# Patient Record
Sex: Female | Born: 1985 | Hispanic: Yes | Marital: Married | Smoking: Never smoker
Health system: Southern US, Community
[De-identification: ages and names within clinical notes are randomized; demographics above are authoritative.]

## PROBLEM LIST (undated history)

## (undated) ENCOUNTER — Inpatient Hospital Stay (HOSPITAL_COMMUNITY): Payer: Self-pay

## (undated) DIAGNOSIS — Z789 Other specified health status: Secondary | ICD-10-CM

## (undated) HISTORY — PX: NO PAST SURGERIES: SHX2092

---

## 2013-08-01 ENCOUNTER — Ambulatory Visit: Payer: No Typology Code available for payment source | Attending: Internal Medicine | Admitting: Internal Medicine

## 2013-08-01 ENCOUNTER — Other Ambulatory Visit (HOSPITAL_COMMUNITY)
Admission: RE | Admit: 2013-08-01 | Discharge: 2013-08-01 | Disposition: A | Discharge status: Home / Self Care | Payer: No Typology Code available for payment source | Source: Ambulatory Visit | Attending: Internal Medicine | Admitting: Internal Medicine

## 2013-08-01 ENCOUNTER — Encounter: Payer: Self-pay | Admitting: Internal Medicine

## 2013-08-01 VITALS — BP 103/70 | HR 66 | Temp 99.5°F | Resp 16 | Wt 192.0 lb

## 2013-08-01 DIAGNOSIS — Z0000 Encounter for general adult medical examination without abnormal findings: Secondary | ICD-10-CM

## 2013-08-01 DIAGNOSIS — Z3202 Encounter for pregnancy test, result negative: Secondary | ICD-10-CM | POA: Insufficient documentation

## 2013-08-01 DIAGNOSIS — N760 Acute vaginitis: Secondary | ICD-10-CM | POA: Insufficient documentation

## 2013-08-01 DIAGNOSIS — R350 Frequency of micturition: Secondary | ICD-10-CM | POA: Insufficient documentation

## 2013-08-01 DIAGNOSIS — IMO0002 Reserved for concepts with insufficient information to code with codable children: Secondary | ICD-10-CM

## 2013-08-01 DIAGNOSIS — Z113 Encounter for screening for infections with a predominantly sexual mode of transmission: Secondary | ICD-10-CM | POA: Insufficient documentation

## 2013-08-01 DIAGNOSIS — Z1213 Encounter for screening for malignant neoplasm of small intestine: Secondary | ICD-10-CM | POA: Insufficient documentation

## 2013-08-01 DIAGNOSIS — Z124 Encounter for screening for malignant neoplasm of cervix: Secondary | ICD-10-CM

## 2013-08-01 DIAGNOSIS — N898 Other specified noninflammatory disorders of vagina: Secondary | ICD-10-CM | POA: Insufficient documentation

## 2013-08-01 DIAGNOSIS — Z01419 Encounter for gynecological examination (general) (routine) without abnormal findings: Secondary | ICD-10-CM | POA: Insufficient documentation

## 2013-08-01 LAB — POCT URINALYSIS DIPSTICK
Bilirubin, UA: NEGATIVE
Blood, UA: NEGATIVE
Glucose, UA: NEGATIVE
KETONES UA: NEGATIVE
Leukocytes, UA: NEGATIVE
Nitrite, UA: NEGATIVE
PROTEIN UA: NEGATIVE
SPEC GRAV UA: 1.02
Urobilinogen, UA: 0.2
pH, UA: 7

## 2013-08-01 LAB — POCT URINE PREGNANCY: Preg Test, Ur: NEGATIVE

## 2013-08-01 NOTE — Progress Notes (Signed)
Pt is here to establish care. Pt is requesting a pap smear but she states that she is pregnant.

## 2013-08-01 NOTE — Patient Instructions (Signed)
Prueba de Papanicolaou (Papanicolaou Test) Es una prueba que se [place] para la deteccin del cncer de cuello de tero. Para realizarla, se extraen clulas del cuello del tero que un patlogo (un especialista en el estudio de los tejidos del organismo) examina bajo un microscopio. Las clulas se extraen del cuello del tero mediante el raspado o el cepillado del cuello del tero y sus paredes internas. Es Neomia Dearuna prueba muy precisa para la deteccin del cncer de cuello de tero y ha sido muy til para reducir las muertes a causa de esta enfermedad. Las pruebas de Papanicolaou se informan en trminos de neoplasia cervical intraepitelial (NIC). El trmino neoplasia significa crecimiento nuevo, intraepitelial significa cambios a nivel celular y cervicouterina significa en el cuello del tero. Los subtipos de NIC son los siguientes:   NIC1: displasia (tejido anormal) leve y leve a moderado  NIC2: displasia moderada y leve a grave  NIC3: displasia grave y carcinoma (tumor maligno) en proceso La prueba de Papanicolaou, cuando se la realiza peridicamente, ha resultado de gran ayuda para la deteccin temprana del cncer de cuello de tero, que es tratable si se lo descubre en su fase inicial. Adems, se la [place] para detectar anomalas o hallazgos atpicos. En muchos casos, estos hallazgos son parte del proceso de reparacin del organismo y a menudo se resuelven sin necesidad de Conservation officer, historic buildingsotro tratamiento. Si se hace un lavado vaginal, se da un bao de inmersin o se aplica cremas vaginales 48 a 72horas antes del examen, los resultados de la prueba podran ser "insatisfactorios". PREPARACIN PARA EL ESTUDIO La prueba debe realizarse cuando no tiene el perodo menstrual ni sangrado vaginal. HALLAZGOS NORMALES No se observan clulas anormales o atpicas. Los rangos para los resultados normales pueden variar entre diferentes laboratorios y hospitales. Consulte siempre con su mdico despus de Production assistant, radiohacer el estudio para Artistconocer el  significado de los [place]resultados y si los valores se consideran "dentro de los lmites normales". SIGNIFICADO DEL ESTUDIO  El mdico leer los resultados y Heritage managerhablar con usted sobre la importancia y el significado de los [place] Placeresultados, as como las opciones de tratamiento y la necesidad de Education officer, environmentalrealizar pruebas adicionales, si fuera necesario. OBTENCIN DE LOS RESULTADOS DE LAS PRUEBAS  Es su responsabilidad retirar el resultado del [place]estudio. Consulte en el laboratorio cundo y cmo podr Starbucks Corporationobtener los resultados. Document Released: 01/01/2013 [facility] LLCExitCare Patient Information 2014 [place]iaExitCare, MarylandLLC. Ejercicios para perder peso (Exercise to Lose Weight) La actividad fsica y Neomia Dearuna dieta saludable ayudan a perder peso. El mdico podr sugerirle ejercicios especficos. IDEAS Y CONSEJOS PARA HACER EJERCICIOS  Elija opciones econmicas que disfrute hacer , como caminar, andar en bicicleta o los vdeos para ejercitarse.  Utilice las Microbiologistescaleras en lugar del ascensor.  Camine durante la hora del almuerzo.  Estacione el auto lejos del lugar de Campustrabajo o Redfieldestudio.  Concurra a un gimnasio o tome clases de gimnasia.  Comience con 5  10 minutos de actividad fsica por da. Ejercite hasta 30 minutos, 4 a 6 das por [address] Church [address]semana.  Utilice zapatos que tengan un buen soporte y ropas cmodas.  Elongue antes y despus de Company secretaryejercitar.  Ejercite hasta que aumente la respiracin y el corazn palpite rpido.  Beba agua extra cuando ejercite.  No haga ejercicio Firefighterhasta lastimarse, sentirse mareado o que le falte mucho el aire. La actividad fsica puede quemar alrededor de 150 caloras.  Correr 20 cuadras en 15 minutos.  Jugar vley durante 45 a 60 minutos.  Limpiar y encerar el auto durante 45 a 60 minutos.  Jugar ftbol americano de toque.  Caminar 25 cuadras en 35 minutos.  Empujar un cochecito 20 cuadras en 30 minutos.  Jugar baloncesto durante 30 minutos.  Rastrillar hojas secas durante 30 minutos.  Andar en bicicleta  80 cuadras en 30 minutos.  Caminar 30 cuadras en 30 minutos.  Bailar durante 30 minutos.  Quitar la nieve con una pala durante 15 minutos.  Nadar vigorosamente durante 20 minutos.  Subir escaleras durante 15 minutos.  Andar en bicicleta 60 cuadras durante 15 minutos.  Arreglar el jardn entre 30 y 45 minutos.  Saltar a la soga durante 15 minutos.  Limpiar vidrios o pisos durante 45 a 60 minutos. Document Released: 06/17/2010 Document Revised: 06/05/2011 [facility]ExitCare Patient Information 2014 [place]uelExitCare, MarylandLLC.

## 2013-08-04 NOTE — Progress Notes (Signed)
[name], female   DOB: Feb 23, 1986, 28 y.o.   MRN: [id number]  [id number]  [id number]  DOB - Feb 23, 1986  CC:  Chief Complaint  Patient presents with  . Establish Care       HPI: Angela Rangel is a 28 y.o. female here today to establish medical care. Patient reports no past medical history. Patient reports a one-month history of painful intercourse. She states it feels like she has to urinate during intercourse. After intercourse she feels that it is difficult to walk due to the abdominal pain.  Patient reports increase in urinary frequency without dysuria and hematuria.  Patient reports a yellow vaginal discharge without odor. Patient requests a urine pregnancy test, last menstrual period occurred in the middle of April.  Patient has No headache, No chest pain,  No Nausea, No new weakness tingling or numbness, No Cough - SOB.  No Known Allergies History reviewed. No pertinent past medical history. No current outpatient prescriptions on file prior to visit.   No current facility-administered medications on file prior to visit.   History reviewed. No pertinent family history. History   Social History  . Marital Status: Married    Spouse Name: N/A    Number of Children: N/A  . Years of Education: N/A   Occupational History  . Not on file.   Social History Main Topics  . Smoking status: Never Smoker   . Smokeless tobacco: Not on file  . Alcohol Use: No  . Drug Use: No  . Sexual Activity: Yes   Other Topics Concern  . Not on file   Social History Narrative  . No narrative on file    Review of Systems: Constitutional: Negative for fever, chills, diaphoresis, activity change, appetite change and fatigue. HENT: Negative for ear pain, nosebleeds, congestion, facial swelling, rhinorrhea, neck pain, neck stiffness and ear discharge.  Eyes: Negative for pain, discharge, redness, itching and visual disturbance. Respiratory: Negative for cough,  choking, chest tightness, shortness of breath, wheezing and stridor.  Cardiovascular: Negative for chest pain, palpitations and leg swelling. Gastrointestinal: Negative for abdominal distention. Positive for abdominal pain after intercourse Genitourinary: Negative for dysuria, urgency, hematuria, flank pain, decreased urine volume, difficulty urinating. Positive for dyspareunia and urinary frequency. Musculoskeletal: Negative for back pain, joint swelling, arthralgia and gait problem. Neurological: Negative for dizziness, tremors, seizures, syncope, facial asymmetry, speech difficulty, weakness, light-headedness, numbness and headaches.  Hematological: Negative for adenopathy. Does not bruise/bleed easily. Psychiatric/Behavioral: Negative for hallucinations, behavioral problems, confusion, dysphoric mood, decreased concentration and agitation.    Objective:   Filed Vitals:   08/01/13 1619  BP: 103/70  Pulse: 66  Temp: 99.5 F (37.5 C)  Resp: 16    Physical Exam: Constitutional: Patient appears well-developed and well-nourished. No distress. HENT: Normocephalic, atraumatic, External right and left ear normal. Oropharynx is clear and moist.  Eyes: Conjunctivae and EOM are normal. PERRLA, no scleral icterus. Neck: Normal ROM. Neck supple. No JVD. No tracheal deviation. No thyromegaly. CVS: RRR, S1/S2 +, no murmurs, no gallops, no carotid bruit.  Pulmonary: Effort and breath sounds normal, no stridor, rhonchi, wheezes, rales.  Abdominal: Soft. BS +, no distension, rebound or guarding. Pelvic tenderness  Musculoskeletal: Normal range of motion. No edema and no tenderness. Negative for CVA tenderness. Lymphadenopathy: No lymphadenopathy noted, cervical, inguinal or axillary Neuro: Alert. Normal reflexes, muscle tone coordination. No cranial nerve deficit. Skin: Skin is warm and dry. No rash noted. Not diaphoretic. No erythema. No pallor. Psychiatric: Normal mood and affect.  Behavior,  judgment, thought content normal. Physical Exam  Genitourinary: Rectum normal and uterus normal. Cervix exhibits friability. Cervix exhibits no motion tenderness and no discharge. Right adnexum displays tenderness. Right adnexum displays no mass. Left adnexum displays tenderness. Left adnexum displays no mass. There is tenderness around the vagina.  Lymphadenopathy:       Right: No inguinal adenopathy present.       Left: No inguinal adenopathy present.     No results found for this basename: WBC, HGB, HCT, MCV, PLT   No results found for this basename: CREATININE, BUN, NA, K, CL, CO2    No results found for this basename: HGBA1C   Lipid Panel  No results found for this basename: chol, trig, hdl, cholhdl, vldl, ldlcalc       Assessment and plan:   [name] was seen today for establish care.  Diagnoses and associated orders for this visit:  Preventative health care - POCT urine pregnancy - POCT urinalysis dipstick - TSH; Future - CBC; Future - COMPLETE METABOLIC PANEL WITH GFR; Future - Lipid panel; Future - Hemoglobin A1C; Future  Papanicolaou smear - Cytology - PAP [place] - Cervicovaginal ancillary only  Dyspareunia Will wait on results of STI screening, if negative will order transvaginal ultrasound.   Return in about 1 week (around 08/08/2013), or if symptoms worsen or fail to improve, for Lab Visit. Will call patient with results of Pap.   Due to language barrier, an interpreter was present during the history-taking and subsequent discussion (and for part of the physical exam) with this patient.     Holland CommonsValerie Keck, NP-C [facility] [phone] 08/04/2013, 9:35 PM

## 2013-08-05 LAB — CERVICOVAGINAL ANCILLARY ONLY
Chlamydia: NEGATIVE
NEISSERIA GONORRHEA: NEGATIVE
WET PREP (BD AFFIRM): NEGATIVE
WET PREP (BD AFFIRM): POSITIVE — AB
Wet Prep (BD Affirm): NEGATIVE

## 2013-08-07 ENCOUNTER — Telehealth: Payer: Self-pay

## 2013-08-07 MED ORDER — METRONIDAZOLE 500 MG PO TABS: 500.0000 mg | ORAL_TABLET | Freq: Two times a day (BID) | ORAL | Status: DC

## 2013-08-07 NOTE — Telephone Encounter (Signed)
Message copied by Lestine MountJUAREZ, Wilberta Dorvil L on Thu Aug 07, 2013  5:04 PM ------      Message from: Holland CommonsKECK, VALERIE A      Created: Tue Aug 05, 2013  9:58 PM       Let patient know her STD test came back negative and her pap is normal, she will not need another pap for 3 years.  Let her know she did come back positive for Bacterial Vaginosis.  She will need metronidazole 500 mg BID for 7 days. Let her know she cannot drink alcohol while taking this medication. Let her know if she is still having painful intercourse after treatment then she needs to return to the clinic for further evaluation. Thanks ------

## 2013-08-07 NOTE — Telephone Encounter (Signed)
Interpreter line used Patient is aware of her results [facility]

## 2013-08-08 ENCOUNTER — Ambulatory Visit: Payer: No Typology Code available for payment source | Attending: Internal Medicine

## 2013-08-08 ENCOUNTER — Other Ambulatory Visit: Payer: Self-pay

## 2013-08-08 DIAGNOSIS — N760 Acute vaginitis: Principal | ICD-10-CM

## 2013-08-08 DIAGNOSIS — Z0000 Encounter for general adult medical examination without abnormal findings: Secondary | ICD-10-CM

## 2013-08-08 DIAGNOSIS — B9689 Other specified bacterial agents as the cause of diseases classified elsewhere: Secondary | ICD-10-CM

## 2013-08-08 LAB — CBC
HEMATOCRIT: 39.2 % (ref 36.0–46.0)
HEMOGLOBIN: 13.3 g/dL (ref 12.0–15.0)
MCH: 28.8 pg (ref 26.0–34.0)
MCHC: 33.9 g/dL (ref 30.0–36.0)
MCV: 84.8 fL (ref 78.0–100.0)
Platelets: 255 10*3/uL (ref 150–400)
RBC: 4.62 MIL/uL (ref 3.87–5.11)
RDW: 13.6 % (ref 11.5–15.5)
WBC: 6.6 10*3/uL (ref 4.0–10.5)

## 2013-08-08 MED ORDER — FLUCONAZOLE 150 MG PO TABS: 150.0000 mg | ORAL_TABLET | Freq: Once | ORAL | Status: DC

## 2013-08-08 MED ORDER — METRONIDAZOLE 500 MG PO TABS: 500.0000 mg | ORAL_TABLET | Freq: Two times a day (BID) | ORAL | Status: DC

## 2013-08-08 NOTE — Progress Notes (Signed)
Patient had labwork today and indicated she tried to pick-up her medications; however, pharmacy had not been sent script for Metronidazole 500 mg two times daily. Medication reordered. Discussed with Holland CommonsValerie Keck, NP who also indicated Diflucan 150 mg once should be ordered.  Order sent to pharmacy.  Patient updated.  Spanish interpretor present.

## 2013-08-09 LAB — COMPLETE METABOLIC PANEL WITH GFR
ALT: 25 U/L (ref 0–35)
AST: 21 U/L (ref 0–37)
Albumin: 4.2 g/dL (ref 3.5–5.2)
Alkaline Phosphatase: 56 U/L (ref 39–117)
BUN: 12 mg/dL (ref 6–23)
CALCIUM: 8.9 mg/dL (ref 8.4–10.5)
CHLORIDE: 105 meq/L (ref 96–112)
CO2: 25 meq/L (ref 19–32)
Creat: 0.58 mg/dL (ref 0.50–1.10)
Glucose, Bld: 82 mg/dL (ref 70–99)
Potassium: 4.4 mEq/L (ref 3.5–5.3)
SODIUM: 139 meq/L (ref 135–145)
Total Bilirubin: 0.5 mg/dL (ref 0.2–1.2)
Total Protein: 6.9 g/dL (ref 6.0–8.3)

## 2013-08-09 LAB — TSH: TSH: 2.607 u[IU]/mL (ref 0.350–4.500)

## 2013-08-09 LAB — LIPID PANEL
Cholesterol: 170 mg/dL (ref 0–200)
HDL: 36 mg/dL — ABNORMAL LOW (ref >39)
LDL CALC: 116 mg/dL — AB (ref 0–99)
Total CHOL/HDL Ratio: 4.7 Ratio
Triglycerides: 90 mg/dL (ref <150)
VLDL: 18 mg/dL (ref 0–40)

## 2013-08-09 LAB — HEMOGLOBIN A1C
HEMOGLOBIN A1C: 5.7 % — AB (ref <5.7)
Mean Plasma Glucose: 117 mg/dL — ABNORMAL HIGH (ref <117)

## 2013-12-16 ENCOUNTER — Ambulatory Visit: Payer: No Typology Code available for payment source | Admitting: Internal Medicine

## 2014-01-23 ENCOUNTER — Ambulatory Visit: Payer: Self-pay

## 2014-02-06 ENCOUNTER — Ambulatory Visit: Payer: Self-pay

## 2014-03-02 ENCOUNTER — Ambulatory Visit: Payer: Self-pay | Attending: Internal Medicine

## 2014-06-22 ENCOUNTER — Emergency Department (HOSPITAL_COMMUNITY): Payer: No Typology Code available for payment source

## 2014-06-22 ENCOUNTER — Emergency Department (HOSPITAL_COMMUNITY)
Admission: EM | Admit: 2014-06-22 | Discharge: 2014-06-22 | Disposition: A | Discharge status: Home / Self Care | Payer: No Typology Code available for payment source | Attending: Emergency Medicine | Admitting: Emergency Medicine

## 2014-06-22 ENCOUNTER — Encounter (HOSPITAL_COMMUNITY): Payer: Self-pay | Admitting: Radiology

## 2014-06-22 DIAGNOSIS — S299XXA Unspecified injury of thorax, initial encounter: Secondary | ICD-10-CM | POA: Insufficient documentation

## 2014-06-22 DIAGNOSIS — S3991XA Unspecified injury of abdomen, initial encounter: Secondary | ICD-10-CM | POA: Insufficient documentation

## 2014-06-22 DIAGNOSIS — S8001XA Contusion of right knee, initial encounter: Secondary | ICD-10-CM | POA: Insufficient documentation

## 2014-06-22 DIAGNOSIS — Y92410 Unspecified street and highway as the place of occurrence of the external cause: Secondary | ICD-10-CM | POA: Insufficient documentation

## 2014-06-22 DIAGNOSIS — S3992XA Unspecified injury of lower back, initial encounter: Secondary | ICD-10-CM | POA: Insufficient documentation

## 2014-06-22 DIAGNOSIS — Z79899 Other long term (current) drug therapy: Secondary | ICD-10-CM | POA: Diagnosis not present

## 2014-06-22 DIAGNOSIS — R202 Paresthesia of skin: Secondary | ICD-10-CM | POA: Insufficient documentation

## 2014-06-22 DIAGNOSIS — T148XXA Other injury of unspecified body region, initial encounter: Secondary | ICD-10-CM

## 2014-06-22 DIAGNOSIS — S79911A Unspecified injury of right hip, initial encounter: Secondary | ICD-10-CM | POA: Diagnosis not present

## 2014-06-22 DIAGNOSIS — S60222A Contusion of left hand, initial encounter: Secondary | ICD-10-CM | POA: Diagnosis not present

## 2014-06-22 DIAGNOSIS — S060X0A Concussion without loss of consciousness, initial encounter: Secondary | ICD-10-CM | POA: Diagnosis not present

## 2014-06-22 DIAGNOSIS — M25561 Pain in right knee: Secondary | ICD-10-CM

## 2014-06-22 DIAGNOSIS — Y9389 Activity, other specified: Secondary | ICD-10-CM | POA: Diagnosis not present

## 2014-06-22 DIAGNOSIS — Z3202 Encounter for pregnancy test, result negative: Secondary | ICD-10-CM | POA: Insufficient documentation

## 2014-06-22 DIAGNOSIS — Y998 Other external cause status: Secondary | ICD-10-CM | POA: Insufficient documentation

## 2014-06-22 DIAGNOSIS — S8991XA Unspecified injury of right lower leg, initial encounter: Secondary | ICD-10-CM | POA: Diagnosis present

## 2014-06-22 DIAGNOSIS — Z792 Long term (current) use of antibiotics: Secondary | ICD-10-CM | POA: Diagnosis not present

## 2014-06-22 LAB — CBC WITH DIFFERENTIAL/PLATELET
BASOS PCT: 0 % (ref 0–1)
Basophils Absolute: 0 10*3/uL (ref 0.0–0.1)
Eosinophils Absolute: 0.5 10*3/uL (ref 0.0–0.7)
Eosinophils Relative: 6 % — ABNORMAL HIGH (ref 0–5)
HEMATOCRIT: 40.5 % (ref 36.0–46.0)
HEMOGLOBIN: 13.2 g/dL (ref 12.0–15.0)
LYMPHS PCT: 25 % (ref 12–46)
Lymphs Abs: 2.2 10*3/uL (ref 0.7–4.0)
MCH: 28.3 pg (ref 26.0–34.0)
MCHC: 32.6 g/dL (ref 30.0–36.0)
MCV: 86.9 fL (ref 78.0–100.0)
Monocytes Absolute: 0.7 10*3/uL (ref 0.1–1.0)
Monocytes Relative: 8 % (ref 3–12)
NEUTROS ABS: 5.3 10*3/uL (ref 1.7–7.7)
Neutrophils Relative %: 61 % (ref 43–77)
Platelets: 213 10*3/uL (ref 150–400)
RBC: 4.66 MIL/uL (ref 3.87–5.11)
RDW: 13.8 % (ref 11.5–15.5)
WBC: 8.7 10*3/uL (ref 4.0–10.5)

## 2014-06-22 LAB — COMPREHENSIVE METABOLIC PANEL
ALT: 18 U/L (ref 0–35)
AST: 24 U/L (ref 0–37)
Albumin: 3.6 g/dL (ref 3.5–5.2)
Alkaline Phosphatase: 56 U/L (ref 39–117)
Anion gap: 8 (ref 5–15)
BILIRUBIN TOTAL: 0.4 mg/dL (ref 0.3–1.2)
BUN: 9 mg/dL (ref 6–23)
CHLORIDE: 111 mmol/L (ref 96–112)
CO2: 22 mmol/L (ref 19–32)
Calcium: 8.8 mg/dL (ref 8.4–10.5)
Creatinine, Ser: 0.61 mg/dL (ref 0.50–1.10)
GFR calc Af Amer: >90 mL/min (ref >90)
Glucose, Bld: 113 mg/dL — ABNORMAL HIGH (ref 70–99)
Potassium: 3.6 mmol/L (ref 3.5–5.1)
Sodium: 141 mmol/L (ref 135–145)
Total Protein: 6.5 g/dL (ref 6.0–8.3)

## 2014-06-22 LAB — TROPONIN I: Troponin I: <0.03 ng/mL (ref <0.031)

## 2014-06-22 LAB — POC URINE PREG, ED: PREG TEST UR: NEGATIVE

## 2014-06-22 LAB — I-STAT BETA HCG BLOOD, ED (MC, WL, AP ONLY): I-stat hCG, quantitative: <5 m[IU]/mL (ref <5)
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
## 2014-06-22 MED ORDER — MORPHINE SULFATE 2 MG/ML IJ SOLN
2.0000 mg | Freq: Once | INTRAMUSCULAR | Status: DC
  Filled 2014-06-22: qty 1

## 2014-06-22 MED ORDER — CYCLOBENZAPRINE HCL 10 MG PO TABS: 5.0000 mg | ORAL_TABLET | Freq: Every day | ORAL | Status: DC

## 2014-06-22 MED ORDER — MORPHINE SULFATE 2 MG/ML IJ SOLN
2.0000 mg | Freq: Once | INTRAMUSCULAR | Status: AC
  Administered 2014-06-22: 2 mg via INTRAVENOUS
  Filled 2014-06-22: qty 1

## 2014-06-22 MED ORDER — SODIUM CHLORIDE 0.9 % IV BOLUS (SEPSIS)
1000.0000 mL | Freq: Once | INTRAVENOUS | Status: AC
  Administered 2014-06-22: 1000 mL via INTRAVENOUS

## 2014-06-22 MED ORDER — IOHEXOL 300 MG/ML  SOLN
100.0000 mL | Freq: Once | INTRAMUSCULAR | Status: AC | PRN
  Administered 2014-06-22: 100 mL via INTRAVENOUS
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
## 2014-06-22 NOTE — Discharge Instructions (Signed)
Please call your doctor for a followup appointment within 24-48 hours. When you talk to your doctor please let them know that you were seen in the emergency department and have them acquire all of your records so that they can discuss the findings with you and formulate a treatment plan to fully care for your new and ongoing problems. Please follow-up with her primary care provider Please follow-up with orthopedics Please rest and stay hydrated Please avoid any physical strenuous activity Please rest and stay hydrated Please take medications as prescribed and on a full stomach. Please take at bedtime for this can lead to drowsiness. Please do not drink alcohol, drive, operate any heavy machinery nor take in daytime. Please apply warm compressions and massage.  Please avoid hitting head within 3 months for this can lead to secondary concussion issues - this can lead to irreversible brain damage. Please continue to monitor symptoms closely and if symptoms are to worsen or change (fever greater than 101, chills, sweating, nausea, vomiting, chest pain, shortness of breathe, difficulty breathing, weakness, numbness, tingling, worsening or changes to pain pattern, headache, dizziness, inability to walk, inability to control urine or bowel movements, back pain, loss of sensation) please report back to the Emergency Department immediately   Por favor, llame a su mdico para una cita de seguimiento dentro de las 24-48 horas. Cuando hable con su mdico por favor, hgales saber que usted se observaron en el servicio de urgencias y Radio producer que se adquieren todos sus registros para que puedan discutir los [place] con usted y formular un plan de tratamiento para cuidar plenamente de sus problemas nuevos y en curso. Por favor, seguimiento con su mdico de atencin primaria Por favor, el seguimiento con la ortopedia Por favor, descansar y [employer] hidratado Por favor, evite cualquier actividad fsica  extenuante Por favor, descansar y mantenerse hidratado Por favor, tome los medicamentos prescritos y con Investment banker, corporate lleno. Por favor, tome antes de acostarse para que esto puede conducir a la somnolencia. Por favor, no beber alcohol, conducir, manejar maquinaria pesada ni tomar [employer]. Por favor aplicar compresiones tibias y masaje. Por favor, evitar golpear la cabeza dentro de 3 meses para que esto puede conducir a problemas de conmocin cerebral secundaria - esto puede conducir a un dao cerebral irreversible. Por favor, seguir de cerca los sntomas de cerca y si los sntomas son a Theme park manager o cambio (fiebre de ms de 101, escalofros, sudoracin, nuseas, vmitos, dolor en el pecho, dificultad para respirar, dificultad para respirar, debilidad, entumecimiento, hormigueo, empeoramiento o cambios en el patrn del dolor , dolor de cabeza, mareos, incapacidad para caminar, incapacidad para controlar la orina o evacuaciones intestinales, dolor de espalda, prdida de la sensibilidad), por favor informe al departamento de emergencias.  Knee Pain The knee is the complex joint between your thigh and your lower leg. It is made up of bones, tendons, ligaments, and cartilage. The bones that make up the knee are:  The femur in the thigh.  The tibia and fibula in the lower leg.  The patella or kneecap riding in the groove on the lower femur. CAUSES  Knee pain is a common complaint with many causes. A few of these causes are:  Injury, such as:  A ruptured ligament or tendon injury.  Torn cartilage.  Medical conditions, such as:  Gout  Arthritis  Infections  Overuse, over training, or overdoing a physical activity. Knee pain can be minor or severe. Knee pain can accompany debilitating injury. Minor knee  problems often respond well to self-care measures or get well on their own. More serious injuries may need medical intervention or even surgery. SYMPTOMS The knee is complex. Symptoms  of knee problems can vary widely. Some of the problems are:  Pain with movement and weight bearing.  Swelling and tenderness.  Buckling of the knee.  Inability to straighten or extend your knee.  Your knee locks and you cannot straighten it.  Warmth and redness with pain and fever.  Deformity or dislocation of the kneecap. DIAGNOSIS  Determining what is wrong may be very straight forward such as when there is an injury. It can also be challenging because of the complexity of the knee. Tests to make a diagnosis may include:  Your caregiver taking a history and doing a physical exam.  Routine X-rays can be used to rule out other problems. X-rays will not reveal a cartilage tear. Some injuries of the knee can be diagnosed by:  Arthroscopy a surgical technique by which a small video camera is inserted through tiny incisions on the sides of the knee. This procedure is used to examine and repair internal knee joint problems. Tiny instruments can be used during arthroscopy to repair the torn knee cartilage (meniscus).  Arthrography is a radiology technique. A contrast liquid is directly injected into the knee joint. Internal structures of the knee joint then become visible on X-ray film.  An MRI scan is a non X-ray radiology procedure in which magnetic fields and a computer produce two- or three-dimensional images of the inside of the knee. Cartilage tears are often visible using an MRI scanner. MRI scans have largely replaced arthrography in diagnosing cartilage tears of the knee.  Blood work.  Examination of the fluid that helps to lubricate the knee joint (synovial fluid). This is done by taking a sample out using a needle and a syringe. TREATMENT The treatment of knee problems depends on the cause. Some of these treatments are:  Depending on the injury, proper casting, splinting, surgery, or physical therapy care will be needed.  Give yourself adequate recovery time. Do not overuse  your joints. If you begin to get sore during workout routines, back off. Slow down or do fewer repetitions.  For repetitive activities such as cycling or running, maintain your strength and nutrition.  Alternate muscle groups. For example, if you are a weight lifter, work the upper body on one day and the lower body the next.  Either tight or weak muscles do not give the proper support for your knee. Tight or weak muscles do not absorb the stress placed on the knee joint. Keep the muscles surrounding the knee strong.  Take care of mechanical problems.  If you have flat feet, orthotics or special shoes may help. See your caregiver if you need help.  Arch supports, sometimes with wedges on the inner or outer aspect of the heel, can help. These can shift pressure away from the side of the knee most bothered by osteoarthritis.  A brace called an "unloader" brace also may be used to help ease the pressure on the most arthritic side of the knee.  If your caregiver has prescribed crutches, braces, wraps or ice, use as directed. The acronym for this is PRICE. This means protection, rest, ice, compression, and elevation.  Nonsteroidal anti-inflammatory drugs (NSAIDs), can help relieve pain. But if taken immediately after an injury, they may actually increase swelling. Take NSAIDs with food in your stomach. Stop them if you develop stomach problems.  Do not take these if you have a history of ulcers, stomach pain, or bleeding from the bowel. Do not take without your caregiver's approval if you have problems with fluid retention, heart failure, or kidney problems.  For ongoing knee problems, physical therapy may be helpful.  Glucosamine and chondroitin are over-the-counter dietary supplements. Both may help relieve the pain of osteoarthritis in the knee. These medicines are different from the usual anti-inflammatory drugs. Glucosamine may decrease the rate of cartilage destruction.  Injections of a  corticosteroid drug into your knee joint may help reduce the symptoms of an arthritis flare-up. They may provide pain relief that lasts a few months. You may have to wait a few months between injections. The injections do have a small increased risk of infection, water retention, and elevated blood sugar levels.  Hyaluronic acid injected into damaged joints may ease pain and provide lubrication. These injections may work by reducing inflammation. A series of shots may give relief for as long as 6 months.  Topical painkillers. Applying certain ointments to your skin may help relieve the pain and stiffness of osteoarthritis. Ask your pharmacist for suggestions. Many over the-counter products are approved for temporary relief of arthritis pain.  In some countries, doctors often prescribe topical NSAIDs for relief of chronic conditions such as arthritis and tendinitis. A review of treatment with NSAID creams found that they worked as well as oral medications but without the serious side effects. PREVENTION  Maintain a healthy weight. Extra pounds put more strain on your joints.  Get strong, stay limber. Weak muscles are a common cause of knee injuries. Stretching is important. Include flexibility exercises in your workouts.  Be smart about exercise. If you have osteoarthritis, chronic knee pain or recurring injuries, you may need to change the way you exercise. This does not mean you have to stop being active. If your knees ache after jogging or playing basketball, consider switching to swimming, water aerobics, or other low-impact activities, at least for a few days a week. Sometimes limiting high-impact activities will provide relief.  Make sure your shoes fit well. Choose footwear that is right for your sport.  Protect your knees. Use the proper gear for knee-sensitive activities. Use kneepads when playing volleyball or laying carpet. Buckle your seat belt every time you drive. Most shattered  kneecaps occur in car accidents.  Rest when you are tired. SEEK MEDICAL CARE IF:  You have knee pain that is continual and does not seem to be getting better.  SEEK IMMEDIATE MEDICAL CARE IF:  Your knee joint feels hot to the touch and you have a high fever. MAKE SURE YOU:   Understand these instructions.  Will watch your condition.  Will get help right away if you are not doing well or get worse. Document Released: 01/08/2007 Document Revised: 06/05/2011 Document Reviewed: 01/08/2007 [facility] Patient Information 2015 [place], [place]. This information is not intended to replace advice given to you by your health care provider. Make sure you discuss any questions you have with your health care provider.  Dolor en la rodilla (Knee Pain) La rodilla es la articulacin compleja entre el muslo y la parte inferior de la pierna. En esta articulacin hay huesos, tendones, ligamentos y TEFL teacher. Los huesos que forman la rodilla son:  El fmur en el muslo.  La tibia y el peron en la pierna.  La rtula montada en la ranura de la parte inferior del muslo. CAUSAS El dolor de rodilla es una causa frecuente de  queja y puede tener varias causas. Algunas son:  Lesiones como:  Ruptura de ligamento o lesin en el tendn.  Esguince del cartlago  Enfermedades como:  Gota.  Artritis.  Infecciones.  Uso excesivo, demasiado entrenamiento o mucha actividad fsica. El dolor de rodilla puede ser leve o intenso. Puede acompaar una lesin debilitante. Los problemas leves con frecuencia responden bien a tratamientos caseros o se mejoran por s mismas. Las lesiones ms graves pueden requerir la intervencin del mdico y Jamse Belfasthasta una ciruga. SNTOMAS La rodilla es una articulacin compleja. Los sntomas pueden variar ampliamente Algunos son:  Dolor con el movimiento o al soportar peso.  Hinchazn y Engineer, miningdolor.  Torsin de la rodilla.  Imposibilidad para estirar la rodilla.  La rodilla se traba y  no puede enderezarla.  Siente calor y se observa enrojecimiento con dolor y Odessafiebre.  Deformidad o dislocacin de la rtula. DIAGNSTICO Determinar cual es el problema puede ser bastante simple, como cuando hay una lesin. Tambin puede ser Estée Laudercomplicado debido a la complejidad de la rodilla. Las pruebas para Education officer, environmentalrealizar un diagnstico son:  Marily MemosHistoria clnica y examen fsico por parte del mdico.  Radiografas para descartar otros problemas. Las radiografas no mostrarn la ruptura del TEFL teachercartlago. Algunas lesiones en la rodilla pueden diagnosticarse del siguiente modo:  La artroscopia es una tcnica quirrgica por la que una pequea cmara de vdeo se inserta en pequeas incisiones que se hacen a los lados de la rodilla. Este procedimiento se [place] Islandsutiliza para examinar y Therapist, sportsreparar los problemas de la articulacin interna de la rodilla. Se utilizan pequeos instrumentos para reparar el cartlago roto (meniscos).  La artrografa es una tcnica radiolgica. Se inyecta un lquido de contraste en la articulacin de la rodilla. Las estructuras internas de la articulacin de la rodilla se hacen visibles en una pelcula de rayos X.  Las imgenes por resonancia magntica son un procedimiento en el que los campos magnticos y una computadora producen imgenes en dos o tres dimensiones del interior de la rodilla. La ruptura del cartlago es visible con esta tcnica. La resonancia magntica ha reemplazado a la artrografa en el diagnstico de la ruptura del cartlago de la rodilla.  Anlisis de [place].  Examen del lquido que lubrica la articulacin de la rodilla (lquido sinovial). Se realiza tomando Colombiauna muestra con [place]una aguja o jeringa. TRATAMIENTO El tratamiento de los problemas de la rodilla depende fundamentalmente de la causa. Algunos de estos tratamientos son:  Segn sea la lesin, un yeso o entablillado, ciruga o fisioterapia.  Permtase el tiempo adecuado de recuperacin. No use demasiado su extremidad  lesionada. Si siente dolor durante los ejercicios de rutina, suspndalos. Hgalos ms lentos o realice menos repeticiones.  En el caso de actividades repetitivas como andar en bicicleta o correr, mantenga la fuerza y Neomia Dearuna buena nutricin.  Alterne los grupos musculares. Por ejemplo, si levanta pesas, trabaje la parte superior del cuerpo Civil engineer, contractingun da, y la parte inferior al da siguiente.  Ni los msculos firmes ni los dbiles proporcionan un sostn adecuado a la rodilla. Los msculos no absorben el estrs que se ejerce sobre la articulacin de la rodilla. Mantenga fuertes los msculos que rodean a la rodilla.  Cudese de los problemas mecnicos:  Si tiene pie plano, los zapatos ortopdicos o especiales pueden ayudar. Comunquese con el profesional que lo asiste si necesita ayuda adicional.  Los soporte de arco con bordes en la zona interna o interna del taco pueden ayudar. Cambian la presin del lado de la rodilla ms comprometido por la  osteoartritis.  Podrn colocarle una ortesis de rodilla para aliviar la presin en la zona ms artrtica de la rodilla.  Si el profesional le ha prescripto muletas, ortesis, un vendaje o hielo, hgalo segn las indicaciones. El acrnimo para este tratamiento es PRICE. Significa proteccin, reposo, hielo, compresin y elevacin.  Los antiinflamatorios no esteroides, pueden ayudar a Engineer, materials. Pero si se toman inmediatamente luego de la lesin, podran aumentar la hinchazn. Tome los corticoides luego de Clinical cytogeneticist. Suspndalos si tiene problemas estomacales. No los tome si tiene una historia de Occupational hygienist, Engineer, mining en el estmago o hemorragia intestinal. No lo tome sin la aprobacin del profesional que la asiste si tiene problemas de retencin de lquidos, insuficencia cardaca o problemas renales.  En los casos crnicos, la fisioterapia puede ser de [place].  La glucosamina y el condroitin son suplementos dietarios de Sales promotion account executive. Ambos pueden Engineer, materials de la  osteoartritis de la rodilla. Estos medicamentos son diferentes de los antiinflamatorios habituales. La glucosamina puede disminuir el porcentaje de destruccin del cartlago.  Las inyecciones de corticoides en la articulacin de la rodilla reducen los sntomas de un brote de artritis. Ofrecen alivio que dura algunos meses. Hay que esperar algunos meses entre la aplicacin de inyecciones. Las inyecciones tiene un pequeo riesgo de infeccin, retencin de lquidos y Engineer, structural de los niveles de Production assistant, radio.  El cido hialurnico inyectado en las articulaciones lesionadas puede aliviar el dolor y proporciona lubricacin. Estas inyecciones funcionan bien reduciendo la inflamacin. Una serie de inyecciones puede proporcionar alivio durante seis meses.  Analgsicos locales. Aplicar ciertos ungentos sobre la piel puede ayudar a Engineer, materials y la rigidez de la osteoartritis. Consulte con el farmacutico, si es necesario. Muchos medicamentos de venta libre estn aprobados para el alivio temporario del dolor artrtico.  En algunos pases los mdicos prescriben antiinflamatorios no esteroides para el alivio de los trastornos crnicos como la artritis y la tendinitis. Un estudio del tratamiento con antiinflamatorios no esteroides aplicados en crema, demostr que funcionaban bien, as como administrados por va oral, pero sin el peligro de los [place]. PREVENCIN  Mantenga un peso normal. Los kilos de ms agregan tensin a las articulaciones.  Mantngase fuerte y gil. Los msculos dbiles son Neomia Dear causa frecuente de lesiones en la rodilla. La elongacin es importante. Incluya ejercicios de flexibilidad en sus rutinas.  Practique actividad fsica con inteligencia. Si sufre osteoartritis, dolor crnico en la rodilla o lesiones recurrentes, podr ser necesario que modifique el modo en que se ejercita. No significa que deba volverse inactivo. Si le duelen las rodillas despus de correr o jugar  basketball, considere la prctica de la natacin, ejercicios aerbicos en el agua u otras actividades de bajo [place], al menos durante [address] o [employer]. En algunos casos, el [employer] [address] de alto impacto ofrece [place].  Asegrese que sus zapatos le [place]. Elija el calzado deportivo adecuado para su deporte.  Proteja sus rodillas. Use la proteccin adecuada para las actividades que puedan afectar a sus rodillas. Use rodilleras cuando juegue al vley o se arrodille. Colquese el cinturn de seguridad cada vez que conduzca. La mayor parte de las fracturas de rtula ocurren en accidentes automvilsticos.  Descanse cuando se sienta cansado. SOLICITE ATENCIN MDICA SI: Tiene dolor en la rodilla que es continuo y no parece mejorar.  SOLICITE ATENCIN MDICA DE INMEDIATO SI:  La articulacin de la rodilla se siente caliente al tacto y usted tiene fiebre. EST SEGURO QUE:   Comprende  las instrucciones para el alta mdica.  Controlar su enfermedad.  Solicitar atencin mdica de inmediato segn las indicaciones. Document Released: 08/30/2007 Document Revised: 06/05/2011 [facility] Patient Information 2015 [place], [place]. This information is not intended to replace advice given to you by your health care provider. Make sure you discuss any questions you have with your health care provider.  Conmocin (Concussion) Un traumatismo directo en la cabeza generalmente causa un trastorno denominado conmocin. Esta lesin podra interferir en el funcionamiento del cerebro y causarle un desmayo (prdida de conciencia). Las consecuencias generalmente son a Product manager, Biomedical engineer las conmociones repetidas pueden ser muy peligrosas. Si sufre mltiples conmociones, tendr ms riesgo de [employer] a [employer], como trastornos del habla, lentitud [employer], trastornos del pensamiento o temblores. La gravedad de la conmocin depende de la extensin y la gravedad de la  interferencia de la actividad cerebral. SNTOMAS  Los sntomas varan segn la gravedad de la lesin. Las conmociones muy leves pueden ocurrir sin siquiera notar los sntomas. La hinchazn en la zona de la lesin no se relaciona con la gravedad de la lesin.   Conmociones leves:  Puede o no producirse prdida temporal de la conciencia.  Prdida de la memoria (amnesia) durante un breve perodo.  Inestabilidad emocional.  Confusin.  Conmociones graves:  En general, prdida prolongada de la conciencia.  Confusin.  Una pupila (la zona negra en el medio del ojo) es ms grande que la [place].  Cambios en la visin (incluyendo visin borrosa).  Cambios en la respiracin.  Trastornos del equilibrio.  Dolores de [place].  Confusin.  Nuseas o vmitos.  Tiempo de reaccin ms lento que lo habitual.  Dificultad para aprender o recordar cosas que ha escuchado. CAUSAS  Una conmocin es el resultado de un traumatismo en la cabeza. Cuando la cabeza sufre una lesin, el cerebro golpea contra la pared interna del crneo. Este impacto causa un dao en el cerebro. La fuerza del traumatismo se relaciona con la gravedad de la lesin. En los casos ms graves se asocia con incidentes que involucran grandes fuerzas de [address], como en el caso de los accidentes automovilsticos. El uso de un casco reduce la gravedad del traumatismo en la cabeza, pero las conmociones pueden ocurrir aun usando casco. EL RIESGO AUMENTA CON:  Deportes de contacto (ftbol americano, hockey, ftbol, basquetbol, rugby o lacrosse).  Deportes que requieran Emergency planning/management officer (boxeo o artes Therapist, nutritional).  Conducir bicicletas, motos o caballos (sin casco). PREVENCIN  Use el casco protector adecuado y asegure su correcta fijacin.  Use el cinturn de seguridad al conducir su automvil.  No beba ni use drogas que alteran la conciencia cuando maneje. PRONSTICO  Generalmente las conmociones se curan si se reconocen y  tratan precozmente. Si una conmocin grave o mltiples conmociones no se tratan, puede haber complicaciones potencialmente mortales o que causen discapacidad permanente y dao cerebral. COMPLICACIONES RELACIONADAS   Lesiones cerebrales permanentes (trastornos del habla, movimientos lentos, trastornos del pensamiento o temblores).  Hemorragia debajo del crneo (hemorragia o hematoma subdural,  hematoma epidural).  Hemorragias en el cerebro.  Tiempo de curacin prolongado si las actividades normales se retoman rpidamente.  Infecciones en la piel, si el sitio en el que se produjo la conmocin presenta una herida abierta.  Aumento del riesgo de futuras conmociones (se requiere un traumatismo menor que la primera vez para una segunda conmocin). TRATAMIENTO  El tratamiento inicial requiere una evaluacin inmediata para determinar la gravedad de la conmocin. En algunas ocasiones puede ser [employer]  la hospitalizacin para realizar una buena observacin y [place].  Evite realizar esfuerzos. Se recomienda el reposo en cama durante las primeras 24 a 48horas.  El regreso a Corporate treasurer de deportes es un tema controvertido debido al aumento del riesgo de sufrir futuras lesiones, as Chief Technology Officer, y debe discutirse con el mdico que lo asiste. Muchos factores, como la gravedad de la conmocin y si es Financial risk analyst, segundo o tercer episodio juegan un papel importante en la decisin del momento en que el paciente puede regresar al deporte.  MEDICAMENTOS  No administre ningn medicamento, inclusive los de [address] como acetaminofeno o aspirina, hasta que el diagnstico se confirme. Estos medicamentos pueden enmascarar el desarrollo de los sntomas.  SOLICITE ATENCIN MDICA DE INMEDIATO SI:   Los sntomas empeoran o no mejoran en 24horas.  Observa alguno de los siguientes sntomas:  Vmitos.  Incapacidad de [employer] y piernas de [place].  [place].  Rigidez en el  cuello.  Pupilas de tamao, forma o reactividad diferente.  Tiene convulsiones.  Agitacin evidente.  Dolor de cabeza intenso, que persiste por ms de 4horas luego de la lesin.  Confusin, desorientacin o modificaciones en el estado mental. Document Released: 12/28/2005 Document Revised: 01/01/2013 [employer] Patient Information 2015 [place], [place]. This information is not intended to replace advice given to you by your health care provider. Make sure you discuss any questions you have with your health care provider.

## 2014-06-22 NOTE — ED Notes (Signed)
Signature pad not functioning.  Pt verbalized understanding of discharge instructions and reasons for coming back to the ED.  Interpreter phone used for all discharge instructions.  All questions answered.
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
## 2014-07-09 ENCOUNTER — Ambulatory Visit: Payer: No Typology Code available for payment source | Attending: Internal Medicine | Admitting: Internal Medicine

## 2014-07-09 ENCOUNTER — Encounter: Payer: Self-pay | Admitting: Internal Medicine

## 2014-07-09 VITALS — BP 112/78 | HR 65 | Temp 98.9°F | Resp 16 | Ht 62.0 in | Wt 186.0 lb

## 2014-07-09 DIAGNOSIS — Z8742 Personal history of other diseases of the female genital tract: Secondary | ICD-10-CM | POA: Insufficient documentation

## 2014-07-09 DIAGNOSIS — Z0000 Encounter for general adult medical examination without abnormal findings: Secondary | ICD-10-CM

## 2014-07-09 DIAGNOSIS — Z3009 Encounter for other general counseling and advice on contraception: Secondary | ICD-10-CM

## 2014-07-09 DIAGNOSIS — N926 Irregular menstruation, unspecified: Secondary | ICD-10-CM

## 2014-07-09 DIAGNOSIS — Z309 Encounter for contraceptive management, unspecified: Secondary | ICD-10-CM

## 2014-07-09 LAB — POCT URINALYSIS DIPSTICK
Bilirubin, UA: NEGATIVE
Glucose, UA: NEGATIVE
Ketones, UA: 15
Leukocytes, UA: NEGATIVE
Nitrite, UA: NEGATIVE
Protein, UA: NEGATIVE
RBC UA: NEGATIVE
Spec Grav, UA: 1.025
UROBILINOGEN UA: 0.2
pH, UA: 7

## 2014-07-09 NOTE — Progress Notes (Signed)
[name], female   DOB: 04-Dec-1985, 29 y.o.   MRN: [id number]  CC: abdominal pain and BC  HPI: Angela Rangel is a 29 y.o. female here today for a follow up visit.  Patient has past medical history of ovarian cyst 4 years ago. She c/o of abdominal pain that feels similar to when she had the ovarian cyst. Periods are irregular. She states that she may skip 2-3 months of periods. LMP was 2 weeks ago. Periods can last up to 15 days and are heavy. She has increased pain with menstruation.  Abdominal pain described as throbbing sensation and pinching.  She is interested in birth control. Has had Depo injections and IUD in the past. She had her last IUD removed 4 years ago and would like to have another IUD.   No Known Allergies No past medical history on file. Current Outpatient Prescriptions on File Prior to Visit  Medication Sig Dispense Refill  . naproxen sodium (ANAPROX) 220 MG tablet Take 220 mg by mouth 2 (two) times daily as needed (FOR PAIN).    . fluconazole (DIFLUCAN) 150 MG tablet Take 1 tablet (150 mg total) by mouth once. Take once antibiotics completed. (Patient not taking: Reported on 07/09/2014) 1 tablet 0  . metroNIDAZOLE (FLAGYL) 500 MG tablet Take 1 tablet (500 mg total) by mouth 2 (two) times daily. (Patient not taking: Reported on 07/09/2014) 14 tablet 0   No current facility-administered medications on file prior to visit.   No family history on file. History   Social History  . Marital Status: Married    Spouse Name: N/A  . Number of Children: N/A  . Years of Education: N/A   Occupational History  . Not on file.   Social History Main Topics  . Smoking status: Never Smoker   . Smokeless tobacco: Not on file  . Alcohol Use: No  . Drug Use: No  . Sexual Activity: Yes   Other Topics Concern  . Not on file   Social History Narrative    Review of Systems: See HPI   Objective:   Filed Vitals:   07/09/14 1630  BP: 112/78  Pulse: 65   Temp: 98.9 F (37.2 C)  Resp: 16    Physical Exam  Cardiovascular: Normal rate, regular rhythm and normal heart sounds.   Pulmonary/Chest: Effort normal and breath sounds normal.  Abdominal: Soft. Bowel sounds are normal. There is tenderness (pelvic).     Lab Results  Component Value Date   WBC 8.7 06/22/2014   HGB 13.2 06/22/2014   HCT 40.5 06/22/2014   MCV 86.9 06/22/2014   PLT 213 06/22/2014   Lab Results  Component Value Date   CREATININE 0.61 06/22/2014   BUN 9 06/22/2014   NA 141 06/22/2014   K 3.6 06/22/2014   CL 111 06/22/2014   CO2 22 06/22/2014    Lab Results  Component Value Date   HGBA1C 5.7* 08/08/2013   Lipid Panel     Component Value Date/Time   CHOL 170 08/08/2013 0910   TRIG 90 08/08/2013 0910   HDL 36* 08/08/2013 0910   CHOLHDL 4.7 08/08/2013 0910   VLDL 18 08/08/2013 0910   LDLCALC 116* 08/08/2013 0910       Assessment and plan:   Angela Rangel was seen today for follow-up.  Diagnoses and all orders for this visit:  Irregular menstrual cycle/Birth control counseling Orders: -     Ambulatory referral to Gynecology She may get IUD when she goes  for counseling on irregular cycles, explained that it will likely be a faster appt than waiting for our scholarship process.  Preventative health care Orders: -     POCT urinalysis dipstick  Due to language barrier, an interpreter was present during the history-taking and subsequent discussion (and for part of the physical exam) with this patient.  Patient reports that if she does not get into GYN soon she would like to have IUD placed here. If decides to get here she will need to wait until she is notified about approval for Nexplanon scholarship. Funches will place IUD  Holland Commons, NP 07/14/2014 9:29 PM       Holland Commons, NP-C [facility] [phone] 07/09/2014, 4:57 PM

## 2014-07-09 NOTE — Progress Notes (Signed)
Pt is here c/o abdomen pain that is severe when it starts and it has been this way for several months. Pt is also wanting to talk to the doctor about birth control.  Interpreter UrbannaBelen.

## 2014-07-09 NOTE — Patient Instructions (Signed)
Informacin sobre el dispositivo intrauterino (Intrauterine Device Information) Un dispositivo intrauterino (DIU) se inserta en el tero e impide el embarazo. Hay dos tipos de DIU:   DIU de cobre: este tipo de DIU est recubierto con un alambre de cobre y se inserta dentro del tero. El cobre hace que el tero y las trompas de Falopio produzcan un liquido que destruye los espermatozoides. El DIU de cobre puede permanecer en el lugar durante 10 aos.  DIU con hormona: este tipo de DIU contiene la hormona progestina (progesterona sinttica). Las hormonas hacen que el moco cervical se haga ms espeso, lo que evita que el esperma ingrese al tero. Tambin hace que la membrana que recubre internamente al tero sea ms delgada lo que impide el implante del vulo fertilizado. La hormona debilita o destruye los espermatozoides que ingresan al tero. Alguno de los tipos de DIU hormonal pueden permanecer en el lugar durante 5 aos y otros tipos pueden dejarse en el lugar por 3 aos. El mdico se asegurar de que usted sea una buena candidata para usar el DIU. Converse con su mdico acerca de los posibles efectos secundarios.  VENTAJAS DEL DISPOSITIVO INTRAUTERINO  El DIU es muy eficaz, reversible, de accin prolongada y de bajo mantenimiento.  No hay efectos secundarios relacionados con el estrgeno.  El DIU puede ser utilizado durante la lactancia.  No est asociado con el aumento de peso.  Funciona inmediatamente despus de la insercin.  El DIU hormonal funciona inmediatamente si se inserta dentro de los 7 das del inicio del perodo. Ser necesario que utilice un mtodo anticonceptivo adicional durante 7 das si el DIU hormonal se inserta en algn otro momento del ciclo.  El DIU de cobre no interfiere con las hormonas femeninas.  El DIU hormonal puede hacer que los perodos menstruales abundantes se hagan ms ligeros y que haya menos clicos.  El DIU hormonal puede usarse durante 3 a 5  aos.  El DIU de cobre puede usarse durante 10 aos. DESVENTAJAS DEL DISPOSITIVO INTRAUTERINO  El DIU hormonal puede estar asociado con patrones de sangrado irregular.  El DIU de cobre puede hacer que el flujo menstrual ms abundante y doloroso.  Puede experimentar clicos y sangrado vaginal despus de la insercin. Document Released: 08/31/2009 Document Revised: 11/13/2012 Exit[employer] Patient Information 2015 [employer]. This information is not intended to replace advice given to you by your health care provider. Make sure you discuss any questions you have with your health care provider.  

## 2014-07-17 ENCOUNTER — Encounter: Payer: Self-pay | Admitting: Obstetrics & Gynecology

## 2014-08-05 ENCOUNTER — Ambulatory Visit: Payer: Self-pay | Attending: Internal Medicine | Admitting: Internal Medicine

## 2014-08-05 ENCOUNTER — Encounter: Payer: Self-pay | Admitting: Internal Medicine

## 2014-08-05 VITALS — BP 111/76 | HR 79 | Temp 98.0°F | Resp 16 | Wt 194.0 lb

## 2014-08-05 DIAGNOSIS — R102 Pelvic and perineal pain: Secondary | ICD-10-CM | POA: Insufficient documentation

## 2014-08-05 DIAGNOSIS — Z3201 Encounter for pregnancy test, result positive: Secondary | ICD-10-CM | POA: Insufficient documentation

## 2014-08-05 DIAGNOSIS — N926 Irregular menstruation, unspecified: Secondary | ICD-10-CM

## 2014-08-05 LAB — POCT URINALYSIS DIPSTICK
Blood, UA: NEGATIVE
GLUCOSE UA: NEGATIVE
Leukocytes, UA: NEGATIVE
Nitrite, UA: NEGATIVE
PH UA: 6
Protein, UA: 30
Spec Grav, UA: >=1.03
Urobilinogen, UA: 0.2

## 2014-08-05 LAB — POCT URINE PREGNANCY: Preg Test, Ur: POSITIVE

## 2014-08-05 MED ORDER — PRENATAL VITAMINS 28-0.8 MG PO TABS: ORAL_TABLET | ORAL | Status: DC

## 2014-08-05 NOTE — Progress Notes (Signed)
Pt is here today c/o pelvic pain. Pt is wanting to know if she is pregnant.  Interpreter BordenBelen.

## 2014-08-05 NOTE — Progress Notes (Signed)
[name], female   DOB: [birth year], 29 y.o.   MRN: [id number]  CC: missed menses   HPI: Angela Rangel is a 29 y.o. female here today for pelvic pain and irregular menses. She reports that her LMP was 06/22/14. She missed a period in April. She is concerned that she is pregnant today because she had a positive home pregnancy test.  She has been having pelvic pain constantly for several days. She currently has two children. She states that she is upset because this is a unplanned pregnancy.   Patient has No headache, No chest pain, No Nausea, No new weakness tingling or numbness, No Cough - SOB.  No Known Allergies History reviewed. No pertinent past medical history. Current Outpatient Prescriptions on File Prior to Visit  Medication Sig Dispense Refill  . fluconazole (DIFLUCAN) 150 MG tablet Take 1 tablet (150 mg total) by mouth once. Take once antibiotics completed. (Patient not taking: Reported on 07/09/2014) 1 tablet 0  . metroNIDAZOLE (FLAGYL) 500 MG tablet Take 1 tablet (500 mg total) by mouth 2 (two) times daily. (Patient not taking: Reported on 07/09/2014) 14 tablet 0  . naproxen sodium (ANAPROX) 220 MG tablet Take 220 mg by mouth 2 (two) times daily as needed (FOR PAIN).     No current facility-administered medications on file prior to visit.   History reviewed. No pertinent family history. History   Social History  . Marital Status: Married    Spouse Name: N/A  . Number of Children: N/A  . Years of Education: N/A   Occupational History  . Not on file.   Social History Main Topics  . Smoking status: Never Smoker   . Smokeless tobacco: Not on file  . Alcohol Use: No  . Drug Use: No  . Sexual Activity: Yes   Other Topics Concern  . Not on file   Social History Narrative    Review of Systems  Constitutional: Negative for fever and chills.  Gastrointestinal: Positive for abdominal pain. Negative for nausea, vomiting and blood in stool.   Genitourinary: Negative.   All other systems reviewed and are negative.   Objective:   Filed Vitals:   08/05/14 1541  BP: 111/76  Pulse: 79  Temp: 98 F (36.7 C)  Resp: 16    Physical Exam  Cardiovascular: Normal rate, regular rhythm and normal heart sounds.   Pulmonary/Chest: Effort normal and breath sounds normal.  Abdominal: Soft. Bowel sounds are normal. She exhibits no distension. There is no tenderness.  Skin: Skin is dry.     Lab Results  Component Value Date   WBC 8.7 06/22/2014   HGB 13.2 06/22/2014   HCT 40.5 06/22/2014   MCV 86.9 06/22/2014   PLT 213 06/22/2014   Lab Results  Component Value Date   CREATININE 0.61 06/22/2014   BUN 9 06/22/2014   NA 141 06/22/2014   K 3.6 06/22/2014   CL 111 06/22/2014   CO2 22 06/22/2014    Lab Results  Component Value Date   HGBA1C 5.7* 08/08/2013   Lipid Panel     Component Value Date/Time   CHOL 170 08/08/2013 0910   TRIG 90 08/08/2013 0910   HDL 36* 08/08/2013 0910   CHOLHDL 4.7 08/08/2013 0910   VLDL 18 08/08/2013 0910   LDLCALC 116* 08/08/2013 0910       Assessment and plan:   Angela Rangel was seen today for follow-up.  Diagnoses and all orders for this visit:  Positive pregnancy test Orders: -  Begin Prenatal Vit-Fe Fumarate-FA (PRENATAL VITAMINS) 28-0.8 MG TABS; One pill once per day Resources for planned parenthood/abortion clinics given. I have also provided patient with information to Dr. Laverle PatterMom program and [facility] and have advised her to seek proper prenatal care asap if she plans to keep the pregnancy.  Irregular menstrual cycle Orders: -     POCT urine pregnancy -     Urinalysis Dipstick See above  Pelvic pain in female Explained signs and symptoms that should warrant immediate attention.  Patient verbalized understanding with teach back used. I have advised patient to seek OB/GYN asap  Due to language barrier, an interpreter was present during the history-taking and  subsequent discussion (and for part of the physical exam) with this patient.  Return if symptoms worsen or fail to improve.       Holland CommonsKECK, Camary Sosa, NP-C [facility] [phone] 08/05/2014, 4:05 PM

## 2014-08-19 ENCOUNTER — Encounter: Payer: Self-pay | Admitting: Obstetrics & Gynecology

## 2014-10-08 LAB — OB RESULTS CONSOLE HEPATITIS B SURFACE ANTIGEN: Hepatitis B Surface Ag: NEGATIVE

## 2014-10-08 LAB — OB RESULTS CONSOLE ABO/RH: RH Type: POSITIVE

## 2014-10-08 LAB — OB RESULTS CONSOLE RPR: RPR: NONREACTIVE

## 2014-10-08 LAB — OB RESULTS CONSOLE ANTIBODY SCREEN: Antibody Screen: NEGATIVE

## 2014-10-08 LAB — OB RESULTS CONSOLE RUBELLA ANTIBODY, IGM: Rubella: IMMUNE

## 2014-10-08 LAB — OB RESULTS CONSOLE HIV ANTIBODY (ROUTINE TESTING): HIV: NONREACTIVE

## 2014-11-02 ENCOUNTER — Other Ambulatory Visit (HOSPITAL_COMMUNITY): Payer: Self-pay | Admitting: Physician Assistant

## 2014-11-02 DIAGNOSIS — Z3689 Encounter for other specified antenatal screening: Secondary | ICD-10-CM

## 2014-11-04 ENCOUNTER — Ambulatory Visit (HOSPITAL_COMMUNITY)
Admission: RE | Admit: 2014-11-04 | Discharge: 2014-11-04 | Disposition: A | Discharge status: Home / Self Care | Payer: Self-pay | Source: Ambulatory Visit | Attending: Physician Assistant | Admitting: Physician Assistant

## 2014-11-04 DIAGNOSIS — Z3689 Encounter for other specified antenatal screening: Secondary | ICD-10-CM

## 2014-11-04 DIAGNOSIS — Z36 Encounter for antenatal screening of mother: Secondary | ICD-10-CM | POA: Insufficient documentation

## 2014-11-04 IMAGING — US US MFM OB COMP +14 WKS
1 series · 12 of 28 positions shown · non-contrast
Comparison: none

[Series 1: us ob +14 all · 12 of 82 slices shown]
[im 4/82]
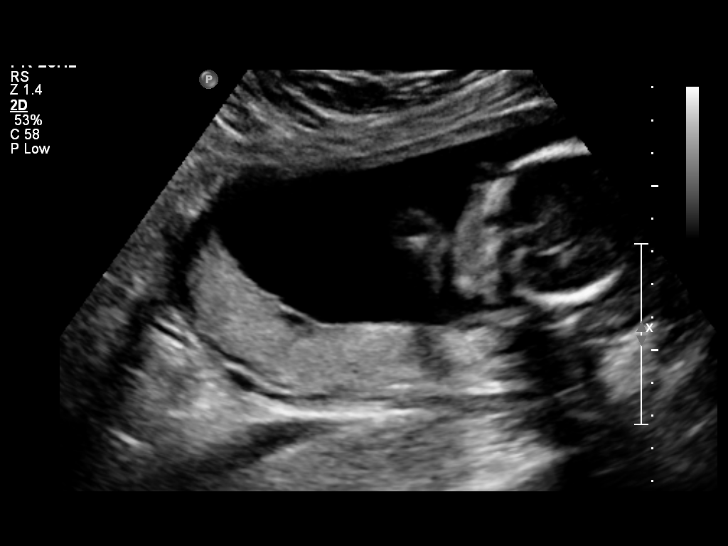
[im 10/82]
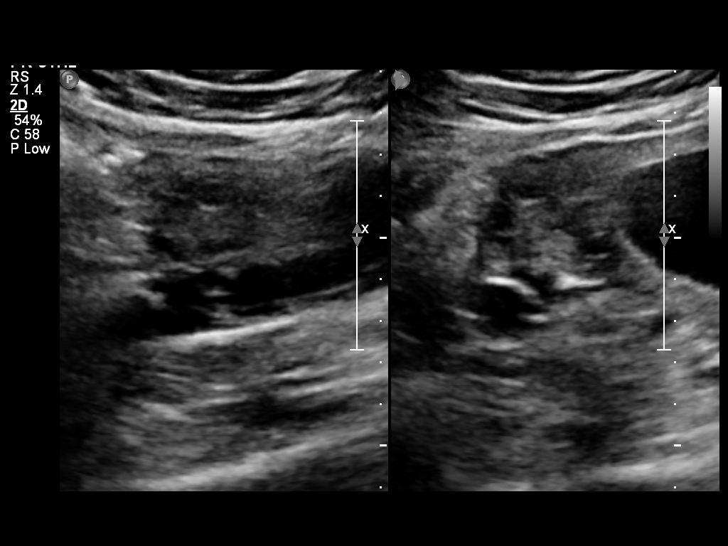
[im 16/82]
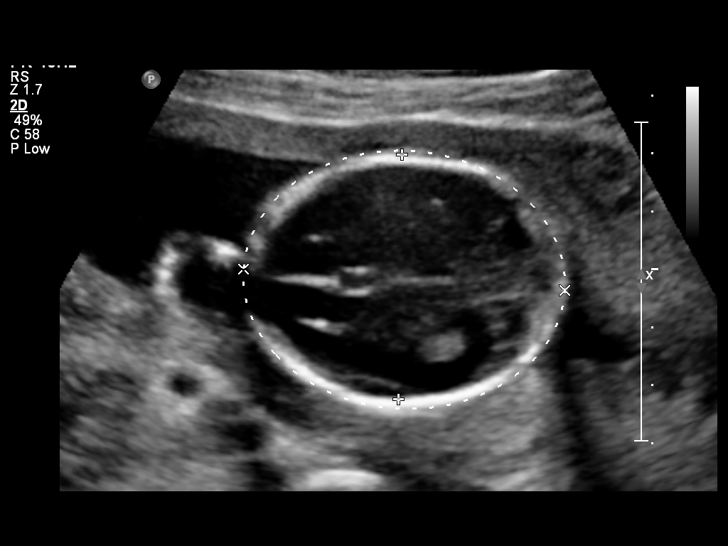
[im 25/82]
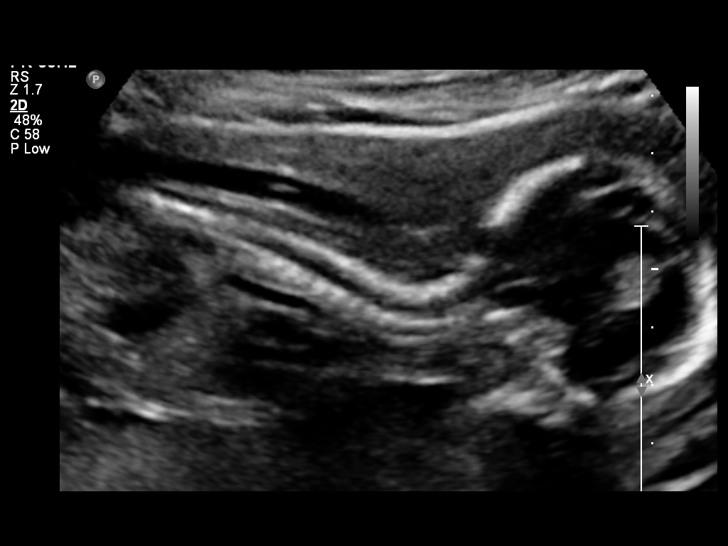
[im 31/82]
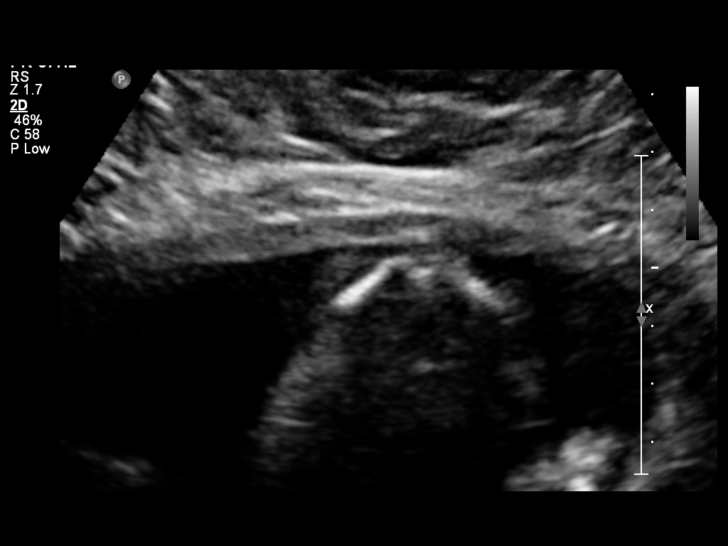
[im 37/82]
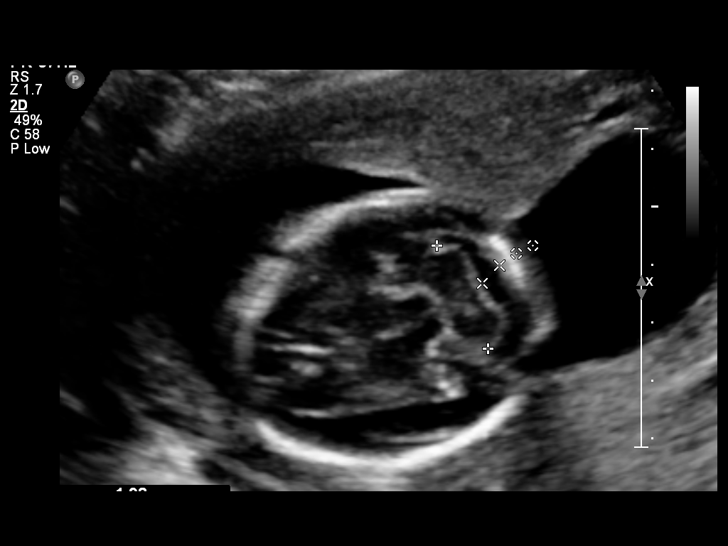
[im 46/82]
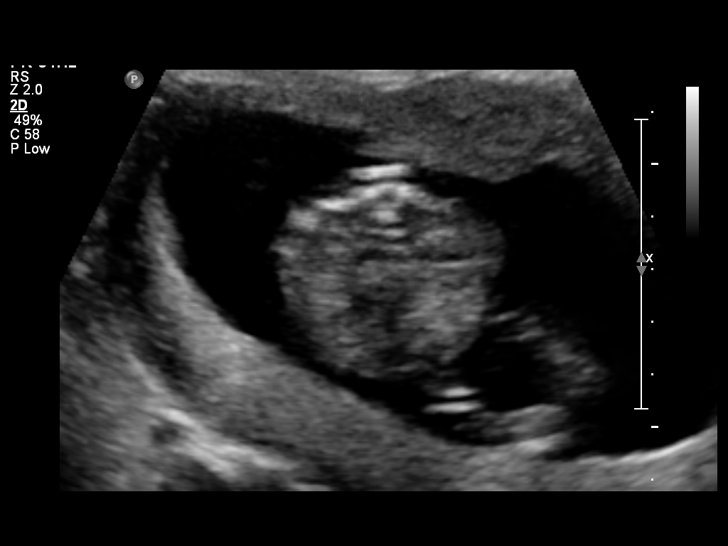
[im 52/82]
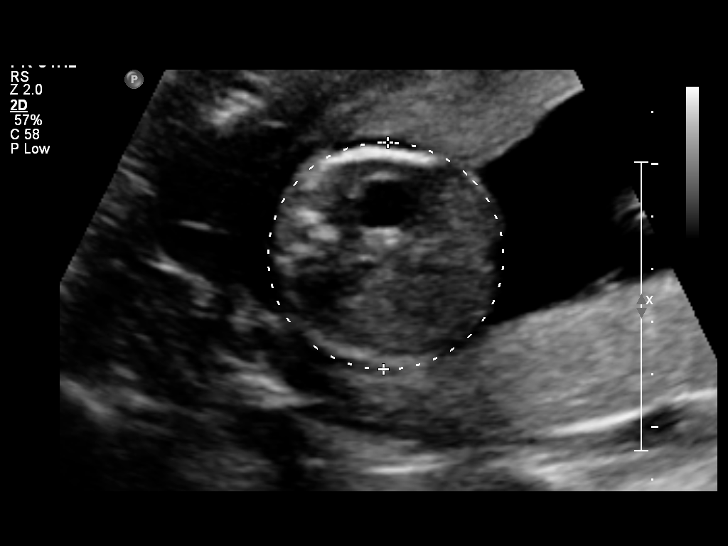
[im 58/82]
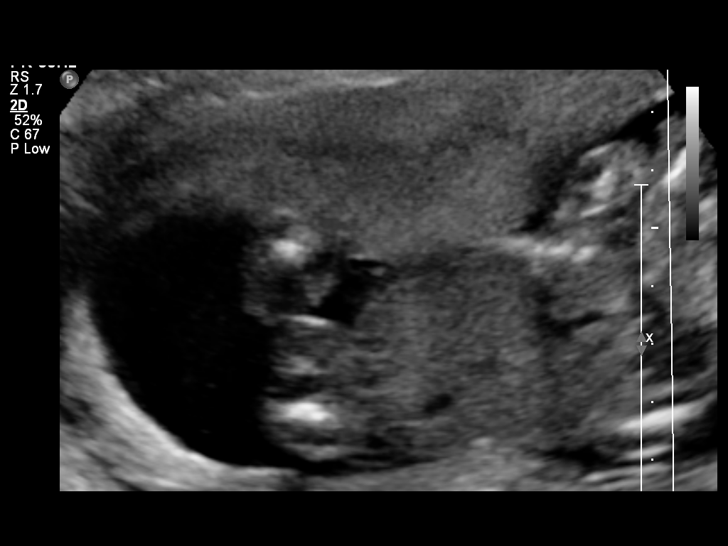
[im 67/82]
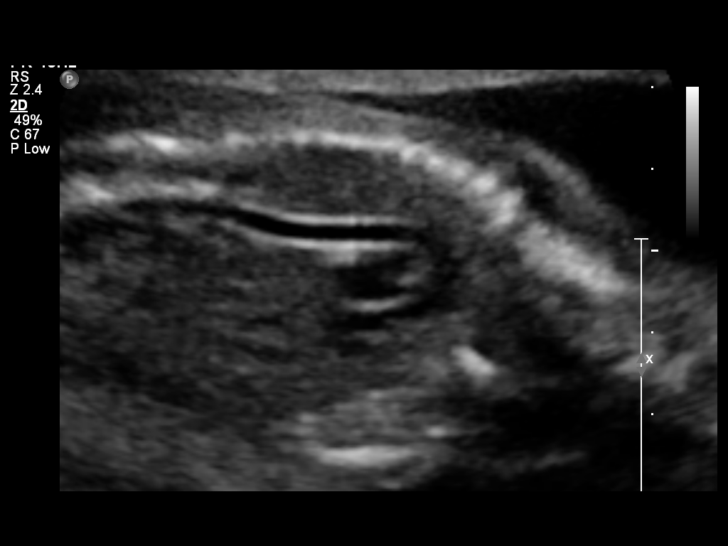
[im 73/82]
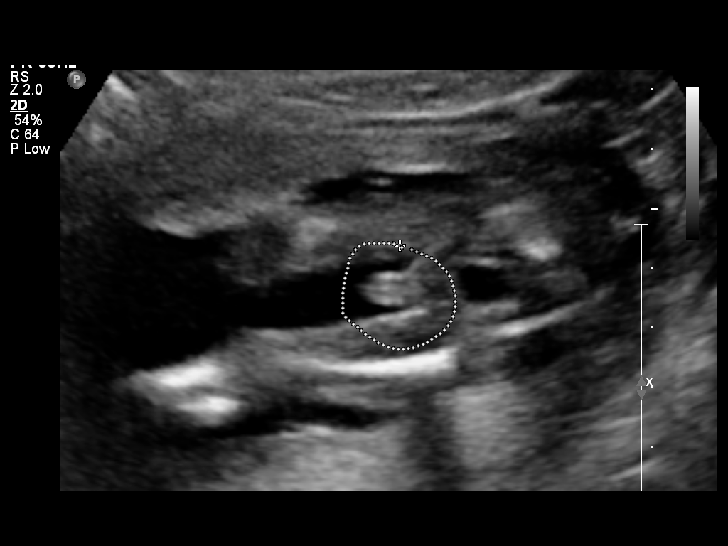
[im 79/82]
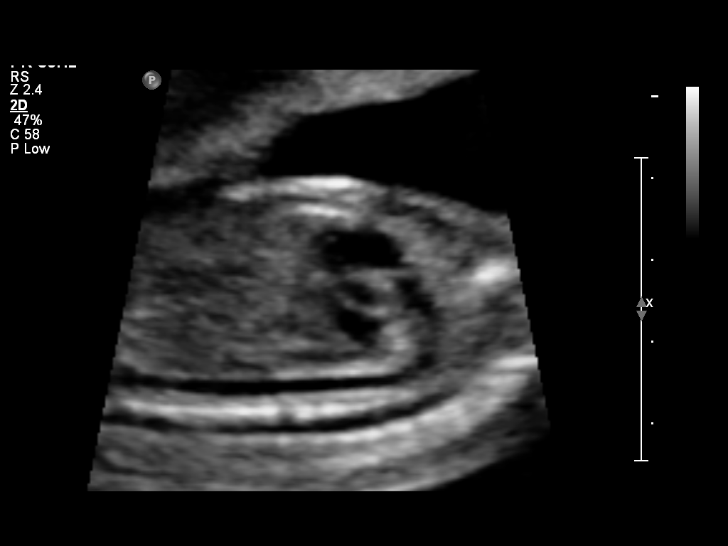

[12 of 28 positions shown; findings below may reference images not displayed]

OBSTETRICS REPORT
(Signed Final [DATE] [DATE])

MARU

Service(s) Provided

Indications

Basic anatomic survey                                 z36
19 weeks gestation of pregnancy
Fetal Evaluation

Num Of Fetuses:    1
Fetal Heart Rate:  138                          bpm
Cardiac Activity:  Observed
Presentation:      Cephalic
Placenta:          Posterior, above cervical
os
P. Cord            Visualized, central
Insertion:

Amniotic Fluid
AFI FV:      Subjectively within normal limits
Larg Pckt:    5.3  cm
Biometry

BPD:     41.9   mm    G. Age:  18w 5d                CI:        71.49    70 - 86
FL/HC:       17.4   16.1 -
18.3
HC:     157.8   mm    G. Age:  18w 5d       22   %   HC/AC:       1.15   1.09 -
1.39
AC:     136.8   mm    G. Age:  19w 1d       45   %   FL/BPD:
FL:      27.5   mm    G. Age:  18w 3d       20   %   FL/AC:       20.1   20 - 24
HUM:     28.7   mm    G. Age:  19w 2d       55   %
CER:     19.8   mm    G. Age:  18w 6d       45   %
NFT:     3.12   mm

Est. FW:     256   gm     0 lb 9 oz     38  %
Gestational Age

LMP:           19w 1d        Date:  [DATE]                 EDD:    [DATE]
U/S Today:     18w 5d                                        EDD:    [DATE]
Best:          19w 1d     Det. By:  LMP  ([DATE])          EDD:    [DATE]
Anatomy

Cranium:          Appears normal         Aortic Arch:       Appears normal
Fetal Cavum:      Appears normal         Ductal Arch:       Appears normal
Ventricles:       Appears normal         Diaphragm:         Appears normal
Choroid Plexus:   Appears normal         Stomach:           Appears normal, left
sided
Cerebellum:       Appears normal         Abdomen:           Appears normal
Posterior Fossa:  Appears normal         Abdominal Wall:    Appears nml (cord
insert, abd wall)
Nuchal Fold:      Appears normal         Cord Vessels:      Appears normal (3
vessel cord)
Face:             Orbits appear          Kidneys:           Appear normal
normal
Lips:             Not well visualized    Bladder:           Appears normal
Heart:            Appears normal         Spine:             Appears normal
(4CH, axis, and
situs)
RVOT:             Appears normal         Lower              Visualized
Extremities:
LVOT:             Appears normal         Upper              Visualized
Extremities:

Other:  Fetus appears to be a male. Technically difficult due to maternal
habitus and fetal position.
Targeted Anatomy

Fetal Central Nervous System
Lat. Ventricles:  6.4                    Cisterna Magna:
Cervix Uterus Adnexa

Cervical Length:    3.8       cm

Cervix:       Normal appearance by transabdominal scan.
Uterus:       No abnormality visualized.
Cul De Sac:   No free fluid seen.
Left Ovary:    Size(cm) L: 2.37 x W: 1.87 x H: 1.27  Volume(cc):
Right Ovary:   Size(cm) L: 2.73 x W: 1.92 x H: 1.82  Volume(cc): 5

Adnexa:     No abnormality visualized.
Impression

SIUP at 19+1 weeks
Normal detailed fetal anatomy; limited views of face
Markers of aneuploidy: none
Normal amniotic fluid volume
Measurements consistent with LMP dating
Recommendations

Follow-up as clinically indicated

## 2014-12-11 ENCOUNTER — Encounter (HOSPITAL_COMMUNITY): Payer: Self-pay

## 2014-12-11 ENCOUNTER — Inpatient Hospital Stay (HOSPITAL_COMMUNITY)
Admission: AD | Admit: 2014-12-11 | Discharge: 2014-12-11 | Disposition: A | Discharge status: Home / Self Care | Payer: Self-pay | Source: Ambulatory Visit | Attending: Obstetrics and Gynecology | Admitting: Obstetrics and Gynecology

## 2014-12-11 DIAGNOSIS — Z3A24 24 weeks gestation of pregnancy: Secondary | ICD-10-CM | POA: Insufficient documentation

## 2014-12-11 DIAGNOSIS — O26892 Other specified pregnancy related conditions, second trimester: Secondary | ICD-10-CM | POA: Insufficient documentation

## 2014-12-11 DIAGNOSIS — R1030 Lower abdominal pain, unspecified: Secondary | ICD-10-CM | POA: Insufficient documentation

## 2014-12-11 DIAGNOSIS — O2312 Infections of bladder in pregnancy, second trimester: Secondary | ICD-10-CM

## 2014-12-11 HISTORY — DX: Other specified health status: Z78.9

## 2014-12-11 LAB — URINALYSIS, ROUTINE W REFLEX MICROSCOPIC
Bilirubin Urine: NEGATIVE
GLUCOSE, UA: NEGATIVE mg/dL
Ketones, ur: NEGATIVE mg/dL
Nitrite: NEGATIVE
PH: 6.5 (ref 5.0–8.0)
Protein, ur: NEGATIVE mg/dL
SPECIFIC GRAVITY, URINE: 1.025 (ref 1.005–1.030)
Urobilinogen, UA: 0.2 mg/dL (ref 0.0–1.0)

## 2014-12-11 LAB — CBC WITH DIFFERENTIAL/PLATELET
BASOS ABS: 0 10*3/uL (ref 0.0–0.1)
Basophils Relative: 0 %
EOS PCT: 3 %
Eosinophils Absolute: 0.3 10*3/uL (ref 0.0–0.7)
HCT: 33.3 % — ABNORMAL LOW (ref 36.0–46.0)
Hemoglobin: 11.2 g/dL — ABNORMAL LOW (ref 12.0–15.0)
LYMPHS PCT: 19 %
Lymphs Abs: 2 10*3/uL (ref 0.7–4.0)
MCH: 29.6 pg (ref 26.0–34.0)
MCHC: 33.6 g/dL (ref 30.0–36.0)
MCV: 87.9 fL (ref 78.0–100.0)
MONO ABS: 0.6 10*3/uL (ref 0.1–1.0)
MONOS PCT: 6 %
Neutro Abs: 7.8 10*3/uL — ABNORMAL HIGH (ref 1.7–7.7)
Neutrophils Relative %: 72 %
PLATELETS: 211 10*3/uL (ref 150–400)
RBC: 3.79 MIL/uL — ABNORMAL LOW (ref 3.87–5.11)
RDW: 13.6 % (ref 11.5–15.5)
WBC: 10.8 10*3/uL — ABNORMAL HIGH (ref 4.0–10.5)

## 2014-12-11 LAB — URINE MICROSCOPIC-ADD ON

## 2014-12-11 LAB — COMPREHENSIVE METABOLIC PANEL
ALBUMIN: 3.1 g/dL — AB (ref 3.5–5.0)
ALK PHOS: 60 U/L (ref 38–126)
ALT: 16 U/L (ref 14–54)
AST: 16 U/L (ref 15–41)
Anion gap: 7 (ref 5–15)
BILIRUBIN TOTAL: 0.4 mg/dL (ref 0.3–1.2)
BUN: 12 mg/dL (ref 6–20)
CALCIUM: 8.6 mg/dL — AB (ref 8.9–10.3)
CO2: 20 mmol/L — ABNORMAL LOW (ref 22–32)
Chloride: 108 mmol/L (ref 101–111)
Creatinine, Ser: 0.46 mg/dL (ref 0.44–1.00)
GFR calc Af Amer: >60 mL/min (ref >60)
GFR calc non Af Amer: >60 mL/min (ref >60)
GLUCOSE: 79 mg/dL (ref 65–99)
POTASSIUM: 3.6 mmol/L (ref 3.5–5.1)
Sodium: 135 mmol/L (ref 135–145)
TOTAL PROTEIN: 6.6 g/dL (ref 6.5–8.1)

## 2014-12-11 LAB — WET PREP, GENITAL
Clue Cells Wet Prep HPF POC: NONE SEEN
Trich, Wet Prep: NONE SEEN

## 2014-12-11 LAB — ABO/RH: ABO/RH(D): O POS

## 2014-12-11 MED ORDER — NITROFURANTOIN MONOHYD MACRO 100 MG PO CAPS: 100.0000 mg | ORAL_CAPSULE | Freq: Two times a day (BID) | ORAL | Status: DC

## 2014-12-11 NOTE — MAU Note (Signed)
Per Bonnye Fava, pt here for lower abdominal pain that radiates to her back. Does have yellow odorous discharge noted since pregnancy.

## 2014-12-11 NOTE — MAU Provider Note (Signed)
MAU HISTORY AND PHYSICAL  Chief Complaint:  pain  Angela Rangel is a 29 y.o.  G3P2002 with IUP at [redacted]w[redacted]d presenting for lower abdominal pain.  Began this morning. Central low abdominal. Constant, but sometimes sharp, other times not sharp. No dysuria but is urinating with more frequency than before. Has some low back pain, no mid or upper. No fever or chills, no vomiting. Did feel nauseaus this morning. No vaginal bleeding but does endorse some vaginal bleeding a few days ago, small amount. No leakage of fluid.    Past Medical History  Diagnosis Date  . Medical history non-contributory     Past Surgical History  Procedure Laterality Date  . No past surgeries      No family history on file.  Social History  Substance Use Topics  . Smoking status: Never Smoker   . Smokeless tobacco: None  . Alcohol Use: No    No Known Allergies  Prescriptions prior to admission  Medication Sig Dispense Refill Last Dose  . acetaminophen (TYLENOL) 500 MG tablet Take 500 mg by mouth every 6 (six) hours as needed for mild pain.   2 weeks  . Prenatal Vit-Fe Fumarate-FA (PRENATAL VITAMINS) 28-0.8 MG TABS One pill once per day (Patient taking differently: Take 1 tablet by mouth daily. ) 30 tablet 3 Past Week at Unknown time    Review of Systems - Negative except for what is mentioned in HPI.  Physical Exam  Blood pressure 109/63, pulse 79, temperature 97.9 F (36.6 C), temperature source Oral, resp. rate 17, weight 191 lb (86.637 kg), last menstrual period 06/22/2014, SpO2 100 %. GENERAL: Well-developed, well-nourished female in no acute distress.  LUNGS: Clear to auscultation bilaterally.  HEART: Regular rate and rhythm. ABDOMEN: Soft, suprapubic ttp, nondistended, gravid. No rebound or guarding. EXTREMITIES: Nontender, no edema, 2+ distal pulses. BACK: no cva tenderness GU: normal vagina and cervix, mod amount white discharge, no pooling, no blood, fingertip/thick/high FHTs:  145/mod/+a/-d   Labs: Results for orders placed or performed during the hospital encounter of 12/11/14 (from the past 24 hour(s))  Urinalysis, Routine w reflex microscopic (not at [facility])   Collection Time: 12/11/14  4:03 PM  Result Value Ref Range   Color, Urine YELLOW YELLOW   APPearance HAZY (A) CLEAR   Specific Gravity, Urine 1.025 1.005 - 1.030   pH 6.5 5.0 - 8.0   Glucose, UA NEGATIVE NEGATIVE mg/dL   Hgb urine dipstick LARGE (A) NEGATIVE   Bilirubin Urine NEGATIVE NEGATIVE   Ketones, ur NEGATIVE NEGATIVE mg/dL   Protein, ur NEGATIVE NEGATIVE mg/dL   Urobilinogen, UA 0.2 0.0 - 1.0 mg/dL   Nitrite NEGATIVE NEGATIVE   Leukocytes, UA SMALL (A) NEGATIVE  Urine microscopic-add on   Collection Time: 12/11/14  4:03 PM  Result Value Ref Range   Squamous Epithelial / LPF RARE RARE   WBC, UA 3-6 <3 WBC/hpf   RBC / HPF 11-20 <3 RBC/hpf   Bacteria, UA MANY (A) RARE   Urine-Other AMORPHOUS URATES/PHOSPHATES   Wet prep, genital   Collection Time: 12/11/14  4:43 PM  Result Value Ref Range   Yeast Wet Prep HPF POC MODERATE (A) NONE SEEN   Trich, Wet Prep NONE SEEN NONE SEEN   Clue Cells Wet Prep HPF POC NONE SEEN NONE SEEN   WBC, Wet Prep HPF POC MANY (A) NONE SEEN  CBC with Differential/Platelet   Collection Time: 12/11/14  4:54 PM  Result Value Ref Range   WBC 10.8 (H) 4.0 -  10.5 K/uL   RBC 3.79 (L) 3.87 - 5.11 MIL/uL   Hemoglobin 11.2 (L) 12.0 - 15.0 g/dL   HCT 16.1 (L) 09.6 - 04.5 %   MCV 87.9 78.0 - 100.0 fL   MCH 29.6 26.0 - 34.0 pg   MCHC 33.6 30.0 - 36.0 g/dL   RDW 40.9 81.1 - 91.4 %   Platelets 211 150 - 400 K/uL   Neutrophils Relative % 72 %   Neutro Abs 7.8 (H) 1.7 - 7.7 K/uL   Lymphocytes Relative 19 %   Lymphs Abs 2.0 0.7 - 4.0 K/uL   Monocytes Relative 6 %   Monocytes Absolute 0.6 0.1 - 1.0 K/uL   Eosinophils Relative 3 %   Eosinophils Absolute 0.3 0.0 - 0.7 K/uL   Basophils Relative 0 %   Basophils Absolute 0.0 0.0 - 0.1 K/uL  Comprehensive metabolic panel    Collection Time: 12/11/14  5:05 PM  Result Value Ref Range   Sodium 135 135 - 145 mmol/L   Potassium 3.6 3.5 - 5.1 mmol/L   Chloride 108 101 - 111 mmol/L   CO2 20 (L) 22 - 32 mmol/L   Glucose, Bld 79 65 - 99 mg/dL   BUN 12 6 - 20 mg/dL   Creatinine, Ser 7.82 0.44 - 1.00 mg/dL   Calcium 8.6 (L) 8.9 - 10.3 mg/dL   Total Protein 6.6 6.5 - 8.1 g/dL   Albumin 3.1 (L) 3.5 - 5.0 g/dL   AST 16 15 - 41 U/L   ALT 16 14 - 54 U/L   Alkaline Phosphatase 60 38 - 126 U/L   Total Bilirubin 0.4 0.3 - 1.2 mg/dL   GFR calc non Af Amer >60 >60 mL/min   GFR calc Af Amer >60 >60 mL/min   Anion gap 7 5 - 15  ABO/Rh   Collection Time: 12/11/14  5:05 PM  Result Value Ref Range   ABO/RH(D) O POS     Imaging Studies:  No results found.  Assessment: Angela Rangel is  29 y.o. [id number] at [redacted]w[redacted]d presents with abdominal pain. Pain is suprapubic and associated with increased urinary frequency; urinalysis is suggestive of possible infection. Afebrile, no n/v, no significantly elevated WBCs, no CVA tenderness: do not think pyelo. No contractions, cervix closed, do not think ptl. NST reactive for gestational age.  Plan: - nitrofurantoin (OK as is second trimester) - PTL, bleeding, and PPROM return precautions - OTC preparations for yeast infection seen on wet prep  Silvano Bilis 9/16/20165:57 PM

## 2014-12-13 LAB — CULTURE, OB URINE: SPECIAL REQUESTS: NORMAL

## 2014-12-14 LAB — GC/CHLAMYDIA PROBE AMP (~~LOC~~) NOT AT ARMC
Chlamydia: NEGATIVE
NEISSERIA GONORRHEA: NEGATIVE

## 2015-03-08 LAB — OB RESULTS CONSOLE GC/CHLAMYDIA
Chlamydia: NEGATIVE
GC PROBE AMP, GENITAL: NEGATIVE

## 2015-03-08 LAB — OB RESULTS CONSOLE GBS: STREP GROUP B AG: NEGATIVE

## 2015-03-27 ENCOUNTER — Inpatient Hospital Stay (HOSPITAL_COMMUNITY)
Admission: AD | Admit: 2015-03-27 | Discharge: 2015-03-29 | DRG: 775 | Disposition: A | Discharge status: Home / Self Care | Payer: Medicaid Other | Source: Ambulatory Visit | Attending: Obstetrics and Gynecology | Admitting: Obstetrics and Gynecology

## 2015-03-27 ENCOUNTER — Encounter (HOSPITAL_COMMUNITY): Payer: Self-pay | Admitting: *Deleted

## 2015-03-27 ENCOUNTER — Inpatient Hospital Stay (HOSPITAL_COMMUNITY): Payer: Medicaid Other | Admitting: Anesthesiology

## 2015-03-27 DIAGNOSIS — Z3A39 39 weeks gestation of pregnancy: Secondary | ICD-10-CM | POA: Diagnosis not present

## 2015-03-27 DIAGNOSIS — O4292 Full-term premature rupture of membranes, unspecified as to length of time between rupture and onset of labor: Principal | ICD-10-CM | POA: Diagnosis present

## 2015-03-27 LAB — RPR: RPR: NONREACTIVE

## 2015-03-27 LAB — CBC
HCT: 36.8 % (ref 36.0–46.0)
HEMOGLOBIN: 12.4 g/dL (ref 12.0–15.0)
MCH: 28.6 pg (ref 26.0–34.0)
MCHC: 33.7 g/dL (ref 30.0–36.0)
MCV: 84.8 fL (ref 78.0–100.0)
Platelets: 208 10*3/uL (ref 150–400)
RBC: 4.34 MIL/uL (ref 3.87–5.11)
RDW: 14.4 % (ref 11.5–15.5)
WBC: 10 10*3/uL (ref 4.0–10.5)

## 2015-03-27 LAB — TYPE AND SCREEN
ABO/RH(D): O POS
Antibody Screen: NEGATIVE

## 2015-03-27 LAB — POCT FERN TEST: POCT Fern Test: POSITIVE

## 2015-03-27 MED ORDER — LACTATED RINGERS IV SOLN
INTRAVENOUS | Status: DC
  Administered 2015-03-27 (×2): via INTRAVENOUS

## 2015-03-27 MED ORDER — ACETAMINOPHEN 325 MG PO TABS
650.0000 mg | ORAL_TABLET | ORAL | Status: DC | PRN
  Administered 2015-03-27 – 2015-03-28 (×2): 650 mg via ORAL
  Filled 2015-03-27 (×2): qty 2

## 2015-03-27 MED ORDER — DIPHENHYDRAMINE HCL 25 MG PO CAPS: 25.0000 mg | ORAL_CAPSULE | Freq: Four times a day (QID) | ORAL | Status: DC | PRN

## 2015-03-27 MED ORDER — PHENYLEPHRINE 40 MCG/ML (10ML) SYRINGE FOR IV PUSH (FOR BLOOD PRESSURE SUPPORT): 80.0000 ug | PREFILLED_SYRINGE | INTRAVENOUS | Status: DC | PRN

## 2015-03-27 MED ORDER — FLEET ENEMA 7-19 GM/118ML RE ENEM: 1.0000 | ENEMA | RECTAL | Status: DC | PRN

## 2015-03-27 MED ORDER — PRENATAL MULTIVITAMIN CH
1.0000 | ORAL_TABLET | Freq: Every day | ORAL | Status: DC
  Administered 2015-03-28 – 2015-03-29 (×2): 1 via ORAL
  Filled 2015-03-27 (×2): qty 1

## 2015-03-27 MED ORDER — ONDANSETRON HCL 4 MG/2ML IJ SOLN: 4.0000 mg | Freq: Four times a day (QID) | INTRAMUSCULAR | Status: DC | PRN

## 2015-03-27 MED ORDER — OXYTOCIN 40 UNITS IN LACTATED RINGERS INFUSION - SIMPLE MED
62.5000 mL/h | INTRAVENOUS | Status: DC
  Administered 2015-03-27: 62.5 mL/h via INTRAVENOUS

## 2015-03-27 MED ORDER — FENTANYL 2.5 MCG/ML BUPIVACAINE 1/10 % EPIDURAL INFUSION (WH - ANES): 14.0000 mL/h | INTRAMUSCULAR | Status: DC | PRN

## 2015-03-27 MED ORDER — SIMETHICONE 80 MG PO CHEW: 80.0000 mg | CHEWABLE_TABLET | ORAL | Status: DC | PRN

## 2015-03-27 MED ORDER — DIPHENHYDRAMINE HCL 50 MG/ML IJ SOLN
12.5000 mg | INTRAMUSCULAR | Status: DC | PRN
Start: 2015-03-27 — End: 2015-03-27

## 2015-03-27 MED ORDER — CITRIC ACID-SODIUM CITRATE 334-500 MG/5ML PO SOLN: 30.0000 mL | ORAL | Status: DC | PRN

## 2015-03-27 MED ORDER — OXYTOCIN 40 UNITS IN LACTATED RINGERS INFUSION - SIMPLE MED
1.0000 m[IU]/min | INTRAVENOUS | Status: DC
  Administered 2015-03-27: 2 m[IU]/min via INTRAVENOUS
  Filled 2015-03-27: qty 1000

## 2015-03-27 MED ORDER — DIPHENHYDRAMINE HCL 50 MG/ML IJ SOLN: 12.5000 mg | INTRAMUSCULAR | Status: DC | PRN

## 2015-03-27 MED ORDER — DIBUCAINE 1 % RE OINT: 1.0000 "application " | TOPICAL_OINTMENT | RECTAL | Status: DC | PRN

## 2015-03-27 MED ORDER — EPHEDRINE 5 MG/ML INJ
10.0000 mg | INTRAVENOUS | Status: DC | PRN
Start: 2015-03-27 — End: 2015-03-27
  Filled 2015-03-27: qty 2

## 2015-03-27 MED ORDER — PHENYLEPHRINE 40 MCG/ML (10ML) SYRINGE FOR IV PUSH (FOR BLOOD PRESSURE SUPPORT)
80.0000 ug | PREFILLED_SYRINGE | INTRAVENOUS | Status: DC | PRN
  Administered 2015-03-27: 80 ug via INTRAVENOUS
  Filled 2015-03-27: qty 2
  Filled 2015-03-27 (×2): qty 20

## 2015-03-27 MED ORDER — OXYTOCIN BOLUS FROM INFUSION: 500.0000 mL | INTRAVENOUS | Status: DC

## 2015-03-27 MED ORDER — LANOLIN HYDROUS EX OINT: TOPICAL_OINTMENT | CUTANEOUS | Status: DC | PRN

## 2015-03-27 MED ORDER — OXYCODONE-ACETAMINOPHEN 5-325 MG PO TABS
1.0000 | ORAL_TABLET | ORAL | Status: DC | PRN
  Administered 2015-03-28 (×2): 1 via ORAL
  Filled 2015-03-27 (×2): qty 1

## 2015-03-27 MED ORDER — TERBUTALINE SULFATE 1 MG/ML IJ SOLN
0.2500 mg | Freq: Once | INTRAMUSCULAR | Status: DC | PRN
  Filled 2015-03-27: qty 1

## 2015-03-27 MED ORDER — LIDOCAINE HCL (PF) 1 % IJ SOLN
INTRAMUSCULAR | Status: DC | PRN
  Administered 2015-03-27 (×2): 8 mL via EPIDURAL

## 2015-03-27 MED ORDER — OXYTOCIN 40 UNITS IN LACTATED RINGERS INFUSION - SIMPLE MED: 62.5000 mL/h | INTRAVENOUS | Status: DC | PRN

## 2015-03-27 MED ORDER — ONDANSETRON HCL 4 MG PO TABS
4.0000 mg | ORAL_TABLET | ORAL | Status: DC | PRN
  Administered 2015-03-27: 4 mg via ORAL
  Filled 2015-03-27: qty 1

## 2015-03-27 MED ORDER — IBUPROFEN 600 MG PO TABS
600.0000 mg | ORAL_TABLET | Freq: Four times a day (QID) | ORAL | Status: DC
  Administered 2015-03-27 – 2015-03-29 (×8): 600 mg via ORAL
  Filled 2015-03-27 (×8): qty 1

## 2015-03-27 MED ORDER — FENTANYL 2.5 MCG/ML BUPIVACAINE 1/10 % EPIDURAL INFUSION (WH - ANES)
14.0000 mL/h | INTRAMUSCULAR | Status: DC | PRN
Start: 2015-03-27 — End: 2015-03-27
  Administered 2015-03-27: 14 mL/h via EPIDURAL
  Filled 2015-03-27: qty 125

## 2015-03-27 MED ORDER — FENTANYL CITRATE (PF) 100 MCG/2ML IJ SOLN
100.0000 ug | INTRAMUSCULAR | Status: DC | PRN
  Administered 2015-03-27 (×2): 100 ug via INTRAVENOUS
  Filled 2015-03-27 (×2): qty 2

## 2015-03-27 MED ORDER — ONDANSETRON HCL 4 MG/2ML IJ SOLN: 4.0000 mg | INTRAMUSCULAR | Status: DC | PRN

## 2015-03-27 MED ORDER — OXYCODONE-ACETAMINOPHEN 5-325 MG PO TABS: 2.0000 | ORAL_TABLET | ORAL | Status: DC | PRN

## 2015-03-27 MED ORDER — EPHEDRINE 5 MG/ML INJ: 10.0000 mg | INTRAVENOUS | Status: DC | PRN

## 2015-03-27 MED ORDER — WITCH HAZEL-GLYCERIN EX PADS: 1.0000 "application " | MEDICATED_PAD | CUTANEOUS | Status: DC | PRN

## 2015-03-27 MED ORDER — LIDOCAINE HCL (PF) 1 % IJ SOLN
30.0000 mL | INTRAMUSCULAR | Status: DC | PRN
  Filled 2015-03-27: qty 30

## 2015-03-27 MED ORDER — OXYCODONE-ACETAMINOPHEN 5-325 MG PO TABS: 1.0000 | ORAL_TABLET | ORAL | Status: DC | PRN

## 2015-03-27 MED ORDER — DOCUSATE SODIUM 100 MG PO CAPS
100.0000 mg | ORAL_CAPSULE | Freq: Two times a day (BID) | ORAL | Status: DC
  Administered 2015-03-27 – 2015-03-29 (×3): 100 mg via ORAL
  Filled 2015-03-27 (×4): qty 1

## 2015-03-27 MED ORDER — ACETAMINOPHEN 325 MG PO TABS
650.0000 mg | ORAL_TABLET | ORAL | Status: DC | PRN
Start: 2015-03-27 — End: 2015-03-27

## 2015-03-27 MED ORDER — BENZOCAINE-MENTHOL 20-0.5 % EX AERO: 1.0000 "application " | INHALATION_SPRAY | CUTANEOUS | Status: DC | PRN

## 2015-03-27 MED ORDER — LACTATED RINGERS IV SOLN: 500.0000 mL | INTRAVENOUS | Status: DC | PRN

## 2015-03-27 NOTE — Progress Notes (Signed)
Assisted Pharmacy Tech with interpretation of medication questions.  Spanish Interpreter

## 2015-03-27 NOTE — Progress Notes (Signed)
Wallene HuhMarta, interpreter @ bedside.  Pt requesting epidural for labor.

## 2015-03-27 NOTE — Lactation Note (Signed)
This note was copied from the chart of [name] L[facility]ation Consultation Note  Patient Name: [name] Today's Date: 03/27/2015 Reason for consult: Initial assessment [name], In-house Spanish interpreter present for visit. This Mom is experienced BF and reports this baby is latching well, denies questions/concerns. Encouraged to continue to BF with feeding ques, cluster feeding discussed. Lactation brochure left for review, advised of OP services and support group. Encouraged to call for questions/concerns or assist if needed.   Maternal Data Has patient been taught Hand Expression?: Yes Does the patient have breastfeeding experience prior to this delivery?: Yes  Feeding Feeding Type: Breast Fed Length of feed: 25 min  LATCH Score/Interventions                      Lactation Tools Discussed/Used     Consult Status Consult Status: Follow-up Date: 03/28/15 Follow-up type: In-patient    [name], [name] 03/27/2015, 11:13 PM

## 2015-03-27 NOTE — Progress Notes (Signed)
Labor Progress Note Angela Rangel is a 29 y.o. G3P2002 at 4269w5d presented for PROM at term  S: Feeling infrequent contractions.   O:  BP 113/70 mmHg  Pulse 80  Temp(Src) 97.4 F (36.3 C) (Oral)  Resp 20  Ht 5' 1.5" (1.562 m)  Wt 209 lb 12.8 oz (95.165 kg)  BMI 39.00 kg/m2  SpO2 100%  LMP 06/22/2014 EFM: 145/mod/+accels, no decels  CVE: Dilation: 3 Effacement (%): 50 Cervical Position: Middle Station: -2 Presentation: Vertex Exam by:: Dr. Alvester MorinNewton   A&P: 29 y.o. [id number] 2669w5d here from PROM  #Labor: Not having regular contractions, start pitocin #Pain: Epidural prn #FWB: Cat I #GBS neg  Angela FlakeKimberly Niles [name], MD 12:39 PM

## 2015-03-27 NOTE — Progress Notes (Signed)
Labor Progress Note Angela Rangel is a 29 y.o. G3P2002 at 152w5d presented for PROM  S: Called to room for prolonged deceleration to 60s. RN had tried position changes, O2 administration, stopped pitocin, and checked patient prior to my arrival. When I entered HR was 100 and proceeded to rise to 150 spontaneously. Patient received her epidural 30minutes PTA. On arrival BP was 99/53   O:  BP 114/71 mmHg  Pulse 80  Temp(Src) 98.4 F (36.9 C) (Oral)  Resp 20  Ht 5' 1.5" (1.562 m)  Wt 209 lb 12.8 oz (95.165 kg)  BMI 39.00 kg/m2  SpO2 99%  LMP 06/22/2014 EFM: 150/mod/+accels (prior to decel), deceleration for ~5 minutes with variability and attempts to return to baseline throughout.   CVE: Dilation: 3 Effacement (%): 50 Cervical Position: Middle Station: -2 Presentation: Vertex Exam by:: Dicky DoeFelicia Morris, RNC   A&P: 29 y.o. [id number] 6352w5d here for PROM now with augmentation with pitocin.  #Labor: stopped pitocin until infant has recovered. Restart pitocin.  #Pain: s/p epidural #FWB: Cat II- likely cause of deceleration is mother's low BP after epidural placement. Patient received phenylephrine.  Continue to monitor closely.  #GBS neg  Federico FlakeKimberly Niles Terriyah Westra, MD 2:55 PM

## 2015-03-27 NOTE — Progress Notes (Signed)
Checked on patients needs.  Ordered patients breakfast. °Spanish Interpreter  °

## 2015-03-27 NOTE — Progress Notes (Signed)
Checked on patients needs.  °Spanish Interpreter  °

## 2015-03-27 NOTE — Progress Notes (Addendum)
Change in baseline noted @ 1415, baseline was 155 with 15x15 accels.

## 2015-03-27 NOTE — Progress Notes (Signed)
Order received by Dr. Alvester MorinNewton to restart Pitocin in 30 minutes

## 2015-03-27 NOTE — Anesthesia Preprocedure Evaluation (Signed)
Anesthesia Evaluation  Patient identified by MRN, date of birth, ID band Patient awake    Reviewed: Allergy & Precautions, H&P , NPO status , Patient's Chart, lab work & pertinent test results  Airway Mallampati: I  TM Distance: >3 FB Neck ROM: full    Dental no notable dental hx.    Pulmonary neg pulmonary ROS,    Pulmonary exam normal        Cardiovascular negative cardio ROS Normal cardiovascular exam     Neuro/Psych negative neurological ROS  negative psych ROS   GI/Hepatic negative GI ROS, Neg liver ROS,   Endo/Other  negative endocrine ROS  Renal/GU negative Renal ROS     Musculoskeletal   Abdominal (+) + obese,   Peds  Hematology negative hematology ROS (+)   Anesthesia Other Findings   Reproductive/Obstetrics (+) Pregnancy                             Anesthesia Physical Anesthesia Plan  ASA: III  Anesthesia Plan: Epidural   Post-op Pain Management:    Induction:   Airway Management Planned:   Additional Equipment:   Intra-op Plan:   Post-operative Plan:   Informed Consent: I have reviewed the patients History and Physical, chart, labs and discussed the procedure including the risks, benefits and alternatives for the proposed anesthesia with the patient or authorized representative who has indicated his/her understanding and acceptance.     Plan Discussed with:   Anesthesia Plan Comments:         Anesthesia Quick Evaluation

## 2015-03-27 NOTE — H&P (Signed)
Angela Rangel is a 29 y.o. female presenting for SROM clear fluid  @ 330am. Uneventful prenatal care through the health department Maternal Medical History:  Reason for admission: Rupture of membranes.   Contractions: Frequency: regular.    Fetal activity: Perceived fetal activity is normal.      OB History    Gravida Para Term Preterm AB TAB SAB Ectopic Multiple Living   3 2 2       2      Past Medical History  Diagnosis Date  . Medical history non-contributory    Past Surgical History  Procedure Laterality Date  . No past surgeries     Family History: family history is not on file. Social History:  reports that she has never smoked. She does not have any smokeless tobacco history on file. She reports that she does not drink alcohol or use illicit drugs.   Prenatal Transfer Tool  Maternal Diabetes: No Genetic Screening: Normal Maternal Ultrasounds/Referrals: Normal Fetal Ultrasounds or other Referrals:  None Maternal Substance Abuse:  No Significant Maternal Medications:  None Significant Maternal Lab Results:  None Other Comments:  None  Review of Systems  Constitutional: Negative for fever.  All other systems reviewed and are negative.   Dilation: 3 Effacement (%): 80 Station: -2 Exam by:: Orinda KennerKimberly Whitehurst Boyd RN Blood pressure 90/51, pulse 82, temperature 97.7 F (36.5 C), resp. rate 18, height 5' 1.5" (1.562 m), weight 95.165 kg (209 lb 12.8 oz), last menstrual period 06/22/2014. Maternal Exam:  Uterine Assessment: Contraction strength is moderate.  Contraction frequency is regular.   Abdomen: Patient reports no abdominal tenderness. Fetal presentation: vertex  Introitus: Normal vulva. Normal vagina.  Amniotic fluid character: clear.  Pelvis: adequate for delivery.   Cervix: Cervix evaluated by digital exam.     Fetal Exam Fetal Monitor Review: Mode: ultrasound.   Variability: moderate (6-25 bpm).   Pattern: accelerations present.     Fetal State Assessment: Category I - tracings are normal.     Physical Exam  Nursing note and vitals reviewed. Constitutional: She is oriented to person, place, and time. She appears well-developed and well-nourished. No distress.  HENT:  Head: Normocephalic and atraumatic.  Neck: Normal range of motion.  Cardiovascular: Normal rate and regular rhythm.   Respiratory: Effort normal and breath sounds normal. No respiratory distress.  GI: Soft. She exhibits no distension.  Genitourinary: Vagina normal.  Musculoskeletal: Normal range of motion.  Neurological: She is alert and oriented to person, place, and time.  Skin: Skin is warm and dry.  Psychiatric: She has a normal mood and affect. Her behavior is normal. Judgment and thought content normal.    Prenatal labs: ABO, Rh: --/--/O POS (12/31 0459) Antibody: NEG (12/31 0459) Rubella: Immune (07/14 0000) RPR: Nonreactive (07/14 0000)  HBsAg: Negative (07/14 0000)  HIV: Non-reactive (07/14 0000)  GBS: Negative (12/12 0000)   Assessment/Plan: IUP @ 39+5 Expectant management Aniticpate vaginal delivery    Clemmons,Lori Grissett 03/27/2015, 6:24 AM

## 2015-03-27 NOTE — Progress Notes (Signed)
Checked on patients needs.  Assisted RN with follow up questions concerning delivery and baby patient.  Spanish Interpreter

## 2015-03-27 NOTE — Progress Notes (Signed)
FHR decel noted to begin @ 1315, lasting 6.255min w/nadir in 60's.  Tracing also noted to have early & variable decel.  Appropriate interventions initiated with FHR return to baseline @ 1324

## 2015-03-27 NOTE — Anesthesia Procedure Notes (Signed)
Epidural Patient location during procedure: OB Start time: 03/27/2015 11:16 AM End time: 03/27/2015 11:20 AM  Staffing Anesthesiologist: Leilani AbleHATCHETT, Gaye Scorza  Preanesthetic Checklist Completed: patient identified, surgical consent, pre-op evaluation, timeout performed, IV checked, risks and benefits discussed and monitors and equipment checked  Epidural Patient position: sitting Prep: site prepped and draped and DuraPrep Patient monitoring: continuous pulse ox and blood pressure Approach: midline Location: L3-L4 Injection technique: LOR air  Needle:  Needle type: Tuohy  Needle gauge: 17 G Needle length: 9 cm and 9 Needle insertion depth: 6 cm Catheter type: closed end flexible Catheter size: 19 Gauge Catheter at skin depth: 11 cm Test dose: negative and Other  Assessment Sensory level: T9 Events: blood not aspirated, injection not painful, no injection resistance, negative IV test and no paresthesia  Additional Notes Reason for block:procedure for pain

## 2015-03-27 NOTE — Progress Notes (Signed)
Checked on patients needs. Also assisted Lactation RN with interpretation of breastfeeding instructions.  Spanish Interpreter

## 2015-03-27 NOTE — Progress Notes (Signed)
Checked on patients needs. [facility]. Also assisted RN with questions post delivery. Spanish Interpreter

## 2015-03-27 NOTE — Progress Notes (Signed)
Checked on patients needs. Assisted RN new shift with interpretation of assessment of mother and baby. Spanish Interpreter

## 2015-03-28 ENCOUNTER — Encounter (HOSPITAL_COMMUNITY): Payer: Self-pay | Admitting: Advanced Practice Midwife

## 2015-03-28 DIAGNOSIS — Z3A39 39 weeks gestation of pregnancy: Secondary | ICD-10-CM

## 2015-03-28 DIAGNOSIS — O4292 Full-term premature rupture of membranes, unspecified as to length of time between rupture and onset of labor: Secondary | ICD-10-CM

## 2015-03-28 LAB — CBC
HEMATOCRIT: 34.4 % — AB (ref 36.0–46.0)
HEMOGLOBIN: 11.5 g/dL — AB (ref 12.0–15.0)
MCH: 28.6 pg (ref 26.0–34.0)
MCHC: 33.4 g/dL (ref 30.0–36.0)
MCV: 85.6 fL (ref 78.0–100.0)
PLATELETS: 209 10*3/uL (ref 150–400)
RBC: 4.02 MIL/uL (ref 3.87–5.11)
RDW: 14.8 % (ref 11.5–15.5)
WBC: 11.1 10*3/uL — ABNORMAL HIGH (ref 4.0–10.5)

## 2015-03-28 NOTE — Progress Notes (Signed)
Post Partum Day 1 Subjective: up ad lib, voiding, tolerating PO and some dizziness and headache today. She reports she did not sleep at all last night and thinks this may be why.    Objective: Blood pressure 89/50, pulse 68, temperature 98.6 F (37 C), temperature source Oral, resp. rate 20, height 5' 1.5" (1.562 m), weight 95.165 kg (209 lb 12.8 oz), last menstrual period 06/22/2014, SpO2 100 %, unknown if currently breastfeeding.  Physical Exam:  General: alert, cooperative and no distress Lochia: appropriate Uterine Fundus: firm Incision: n/a DVT Evaluation: No evidence of DVT seen on physical exam. Negative Homan's sign. No cords or calf tenderness. No significant calf/ankle edema.   Recent Labs  03/27/15 0459  HGB 12.4  HCT 36.8    Assessment/Plan: Plan for discharge tomorrow, Breastfeeding and Contraception Mirena IUD  CBC ordered for today, encouraged pt to drink plenty of fluids, eat regularly, try to sleep today   LOS: 1 day   LEFTWICH-KIRBY, Frank Novelo 03/28/2015, 10:44 AM

## 2015-03-28 NOTE — Progress Notes (Signed)
Checked on patients needs.  Also ordered patient a late snack.  Spanish Interpreter

## 2015-03-28 NOTE — Lactation Note (Signed)
This note was copied from the chart of Angela Rangel. Lactation Consultation Note  Mom reports that BF is going well. She is able to express an abundance of colostrum.  Denies questions for lactation consultant. Patient Name: Angela Rangel Today's Date: 03/28/2015     Maternal Data    Feeding    LATCH Score/Interventions                      Lactation Tools Discussed/Used     Consult Status      Angela Rangel, Angela Rangel 03/28/2015, 4:34 PM

## 2015-03-28 NOTE — Progress Notes (Signed)
Assisted Lactation RN with interpretation of breastfeeding instructions. Also ordered patients dinner and breakfast. Spanish Interpreter

## 2015-03-28 NOTE — L&D Delivery Note (Signed)
Patient is 30 y.o. [id number] 762w5d admitted for PROM. Progressed after augmentation with pitocin. Pushed< 10minutes   Delivery Note At 3:55 PM a viable female was delivered via Vaginal, Spontaneous Delivery (Presentation:OA ).  APGAR: 9, 9; weight 7 lb 2.3 oz (3240 g).   Placenta status: Intact, Spontaneous.  Cord: 2 vessels with the following complications: None.  Cord pH: not collected  Anesthesia: Epidural  Episiotomy: None Lacerations: None Suture Repair: none Est. Blood Loss (mL): 150  Mom to postpartum.  Baby to Couplet care / Skin to Skin.  Isa RankinKimberly Niles [facility] 03/28/2015, 4:10 AM

## 2015-03-28 NOTE — Progress Notes (Signed)
Checked on patients needs.  °Spanish Interpreter  °

## 2015-03-28 NOTE — Anesthesia Postprocedure Evaluation (Signed)
Anesthesia Post Note  [name]  Procedure(s) Performed: * No procedures listed *  Patient location during evaluation: Mother Baby Anesthesia Type: Epidural Level of consciousness: awake and alert and oriented Pain management: satisfactory to patient Vital Signs Assessment: post-procedure vital signs reviewed and stable Respiratory status: spontaneous breathing and nonlabored ventilation Cardiovascular status: stable Postop Assessment: no headache, no backache, no signs of nausea or vomiting, adequate PO intake and patient able to bend at knees (patient up walking) Anesthetic complications: no    Last Vitals:  Filed Vitals:   03/27/15 2300 03/28/15 0647  BP: 95/47 89/50  Pulse: 68 68  Temp: 36.9 C 37 C  Resp: 20 20    Last Pain:  Filed Vitals:   03/28/15 0647  PainSc: 2                  Jisella Ashenfelter

## 2015-03-29 MED ORDER — IBUPROFEN 600 MG PO TABS: 600.0000 mg | ORAL_TABLET | Freq: Four times a day (QID) | ORAL | Status: DC | PRN

## 2015-03-29 NOTE — Progress Notes (Signed)
UR chart review completed.  

## 2015-03-29 NOTE — Progress Notes (Signed)
I assisted Juliette AlcideMelinda, RN with discharge instructions.  Eda H Royal Interpreter.

## 2015-03-29 NOTE — Discharge Instructions (Signed)

## 2015-03-29 NOTE — Progress Notes (Signed)
I assisted Kim CNM with discharge instructions.  Eda H Royal  Interpreter.

## 2015-03-29 NOTE — Discharge Summary (Signed)
OB Discharge Summary     Patient Name: Angela Rangel DOB: 1986/02/17 MRN: [id number]  Date of admission: 03/27/2015 Delivering MD: Federico Flake   Date of discharge: 03/29/2015  Admitting diagnosis: 39wks, [place]roke, CTX Intrauterine pregnancy: [redacted]w[redacted]d     Secondary diagnosis:  Active Problems:   Normal labor  Additional problems: none     Discharge diagnosis: Term Pregnancy Delivered                                                                                                Post partum procedures:none  Augmentation: Pitocin  Complications: None  Hospital course:  Onset of Labor With Vaginal Delivery     30 y.o. yo G3P3003 at [redacted]w[redacted]d was admitted in Latent Labor on 03/27/2015. Patient had an uncomplicated labor course as follows:  Membrane Rupture Time/Date: 3:20 AM ,03/27/2015  Pitocin was started due to irreg ctx (PROM). She progressed to delivery. Intrapartum Procedures: Episiotomy: None [1]                                         Lacerations:  None [1]  Patient had a delivery of a Viable infant. 03/27/2015  Information for the patient's newborn:  Brock Mokry, Boy Abbigal [[id number]]  Delivery Method: Vag-Spont    Pateint had an uncomplicated postpartum course.  She is ambulating, tolerating a regular diet, passing flatus, and urinating well. Patient is discharged home in stable condition on 03/29/2015.    Physical exam  Filed Vitals:   03/27/15 2300 03/28/15 0647 03/28/15 1736 03/29/15 0612  BP: 95/47 89/50 107/62 89/47  Pulse: 68 68 60 64  Temp: 98.4 F (36.9 C) 98.6 F (37 C) 97.7 F (36.5 C) 98.6 F (37 C)  TempSrc: Oral Oral Oral Oral  Resp: 20 20 18 18   Height:      Weight:      SpO2: 100%      General: alert and cooperative Lochia: appropriate Uterine Fundus: firm Incision: N/A DVT Evaluation: No evidence of DVT seen on physical exam. Labs: Lab Results  Component Value Date   WBC 11.1* 03/28/2015   HGB 11.5* 03/28/2015    HCT 34.4* 03/28/2015   MCV 85.6 03/28/2015   PLT 209 03/28/2015   CMP Latest Ref Rng 12/11/2014  Glucose 65 - 99 mg/dL 79  BUN 6 - 20 mg/dL 12  Creatinine 3.08 - 6.57 mg/dL 8.46  Sodium 962 - 952 mmol/L 135  Potassium 3.5 - 5.1 mmol/L 3.6  Chloride 101 - 111 mmol/L 108  CO2 22 - 32 mmol/L 20(L)  Calcium 8.9 - 10.3 mg/dL 8.4(X)  Total Protein 6.5 - 8.1 g/dL 6.6  Total Bilirubin 0.3 - 1.2 mg/dL 0.4  Alkaline Phos 38 - 126 U/L 60  AST 15 - 41 U/L 16  ALT 14 - 54 U/L 16    Discharge instruction: per After Visit Summary and "Baby and Me Booklet".  After visit meds:    Medication List    STOP taking these  medications        acetaminophen 500 MG tablet  Commonly known as:  TYLENOL      TAKE these medications        ibuprofen 600 MG tablet  Commonly known as:  ADVIL,MOTRIN  Take 1 tablet (600 mg total) by mouth every 6 (six) hours as needed.     prenatal multivitamin Tabs tablet  Take 1 tablet by mouth at bedtime.        Diet: routine diet  Activity: Advance as tolerated. Pelvic rest for 6 weeks.   Outpatient follow up:6 weeks Follow up Appt:No future appointments. Follow up Visit:No Follow-up on file.  Postpartum contraception: IUD Mirena  Newborn Data: Live born female  Birth Weight: 7 lb 2.3 oz (3240 g) APGAR: 9, 9  Baby Feeding: Breast Disposition:home with mother   03/29/2015 Cam HaiSHAW, KIMBERLY, CNM  7:40 AM

## 2016-03-27 NOTE — L&D Delivery Note (Signed)
Patient is 31 y.o. [id number] 6440w0d admitted for IOL for cholestasis. S/p IOL with cytotec, followed by Pitocin. AROM at 1915,clear.   Delivery Note At 10:35 PM a viable female was delivered via Vaginal, Spontaneous (Presentation: ROA).  APGAR: 9, 9; weight pending.   Placenta status: Intact, .  Cord: 3V with the following complications: None.  Cord pH: N/A  Anesthesia: Epidural  Episiotomy: None Lacerations: None Est. Blood Loss (mL): 100  Mom to postpartum.  Baby to Couplet care / Skin to Skin.  Head delivered ROA. Nuchal cord x1 present. Shoulder and body delivered in usual fashion. Infant with spontaneous cry, placed on mother's abdomen, dried and bulb suctioned. Cord clamped x 2 after 1-minute delay, and cut by family member. Cord blood drawn. Placenta delivered spontaneously with gentle cord traction. Fundus firm with massage and Pitocin. Perineum inspected and found to have no lacerations.   Angela AdaJazma Phelps, DO OB Fellow

## 2016-08-10 LAB — OB RESULTS CONSOLE RPR: RPR: NONREACTIVE

## 2016-08-10 LAB — OB RESULTS CONSOLE HEPATITIS B SURFACE ANTIGEN: HEP B S AG: NEGATIVE

## 2016-08-10 LAB — OB RESULTS CONSOLE GC/CHLAMYDIA
Chlamydia: NEGATIVE
GC PROBE AMP, GENITAL: NEGATIVE

## 2016-08-10 LAB — OB RESULTS CONSOLE RUBELLA ANTIBODY, IGM: RUBELLA: IMMUNE

## 2016-08-10 LAB — OB RESULTS CONSOLE HIV ANTIBODY (ROUTINE TESTING): HIV: NONREACTIVE

## 2017-01-29 LAB — OB RESULTS CONSOLE GBS: GBS: NEGATIVE

## 2017-02-07 ENCOUNTER — Inpatient Hospital Stay (HOSPITAL_COMMUNITY): Payer: Medicaid Other | Admitting: Anesthesiology

## 2017-02-07 ENCOUNTER — Encounter (HOSPITAL_COMMUNITY): Payer: Self-pay | Admitting: Anesthesiology

## 2017-02-07 ENCOUNTER — Inpatient Hospital Stay (HOSPITAL_COMMUNITY)
Admission: AD | Admit: 2017-02-07 | Discharge: 2017-02-09 | DRG: 805 | Disposition: A | Discharge status: Home / Self Care | Payer: Medicaid Other | Source: Ambulatory Visit | Attending: Obstetrics and Gynecology | Admitting: Obstetrics and Gynecology

## 2017-02-07 ENCOUNTER — Encounter (HOSPITAL_COMMUNITY): Payer: Self-pay | Admitting: *Deleted

## 2017-02-07 DIAGNOSIS — K831 Obstruction of bile duct: Secondary | ICD-10-CM | POA: Diagnosis not present

## 2017-02-07 DIAGNOSIS — Z3490 Encounter for supervision of normal pregnancy, unspecified, unspecified trimester: Secondary | ICD-10-CM | POA: Diagnosis present

## 2017-02-07 DIAGNOSIS — O2662 Liver and biliary tract disorders in childbirth: Secondary | ICD-10-CM | POA: Diagnosis present

## 2017-02-07 DIAGNOSIS — R51 Headache: Secondary | ICD-10-CM | POA: Diagnosis not present

## 2017-02-07 DIAGNOSIS — O26643 Intrahepatic cholestasis of pregnancy, third trimester: Secondary | ICD-10-CM

## 2017-02-07 DIAGNOSIS — Z3A37 37 weeks gestation of pregnancy: Secondary | ICD-10-CM | POA: Diagnosis not present

## 2017-02-07 DIAGNOSIS — O26613 Liver and biliary tract disorders in pregnancy, third trimester: Secondary | ICD-10-CM

## 2017-02-07 LAB — CBC
HEMATOCRIT: 37 % (ref 36.0–46.0)
HEMOGLOBIN: 12.3 g/dL (ref 12.0–15.0)
MCH: 28.4 pg (ref 26.0–34.0)
MCHC: 33.2 g/dL (ref 30.0–36.0)
MCV: 85.5 fL (ref 78.0–100.0)
Platelets: 196 10*3/uL (ref 150–400)
RBC: 4.33 MIL/uL (ref 3.87–5.11)
RDW: 14.3 % (ref 11.5–15.5)
WBC: 9 10*3/uL (ref 4.0–10.5)

## 2017-02-07 LAB — TYPE AND SCREEN
ABO/RH(D): O POS
ANTIBODY SCREEN: NEGATIVE

## 2017-02-07 LAB — RPR: RPR Ser Ql: NONREACTIVE

## 2017-02-07 MED ORDER — LACTATED RINGERS IV SOLN
INTRAVENOUS | Status: DC
  Administered 2017-02-07: 08:00:00 via INTRAVENOUS

## 2017-02-07 MED ORDER — OXYCODONE-ACETAMINOPHEN 5-325 MG PO TABS
2.0000 | ORAL_TABLET | ORAL | Status: DC | PRN
  Administered 2017-02-08 (×2): 2 via ORAL
  Filled 2017-02-07 (×2): qty 2

## 2017-02-07 MED ORDER — EPHEDRINE 5 MG/ML INJ
10.0000 mg | INTRAVENOUS | Status: DC | PRN
  Filled 2017-02-07: qty 2

## 2017-02-07 MED ORDER — PHENYLEPHRINE 40 MCG/ML (10ML) SYRINGE FOR IV PUSH (FOR BLOOD PRESSURE SUPPORT)
80.0000 ug | PREFILLED_SYRINGE | INTRAVENOUS | Status: DC | PRN
  Filled 2017-02-07: qty 5

## 2017-02-07 MED ORDER — LIDOCAINE HCL (PF) 1 % IJ SOLN
INTRAMUSCULAR | Status: DC | PRN
  Administered 2017-02-07: 7 mL via EPIDURAL
  Administered 2017-02-07: 5 mL via EPIDURAL

## 2017-02-07 MED ORDER — OXYTOCIN 40 UNITS IN LACTATED RINGERS INFUSION - SIMPLE MED
1.0000 m[IU]/min | INTRAVENOUS | Status: DC
  Administered 2017-02-07: 2 m[IU]/min via INTRAVENOUS
  Filled 2017-02-07: qty 1000

## 2017-02-07 MED ORDER — DIPHENHYDRAMINE HCL 50 MG/ML IJ SOLN: 12.5000 mg | INTRAMUSCULAR | Status: DC | PRN

## 2017-02-07 MED ORDER — TERBUTALINE SULFATE 1 MG/ML IJ SOLN
0.2500 mg | Freq: Once | INTRAMUSCULAR | Status: DC | PRN
  Filled 2017-02-07: qty 1

## 2017-02-07 MED ORDER — PHENYLEPHRINE 40 MCG/ML (10ML) SYRINGE FOR IV PUSH (FOR BLOOD PRESSURE SUPPORT)
PREFILLED_SYRINGE | INTRAVENOUS | Status: AC
  Filled 2017-02-07: qty 10

## 2017-02-07 MED ORDER — LACTATED RINGERS IV SOLN: 500.0000 mL | INTRAVENOUS | Status: DC | PRN

## 2017-02-07 MED ORDER — OXYTOCIN BOLUS FROM INFUSION: 500.0000 mL | Freq: Once | INTRAVENOUS | Status: DC

## 2017-02-07 MED ORDER — ACETAMINOPHEN 325 MG PO TABS
650.0000 mg | ORAL_TABLET | ORAL | Status: DC | PRN
  Administered 2017-02-07: 650 mg via ORAL
  Filled 2017-02-07: qty 2

## 2017-02-07 MED ORDER — FENTANYL CITRATE (PF) 100 MCG/2ML IJ SOLN
100.0000 ug | INTRAMUSCULAR | Status: DC | PRN
  Administered 2017-02-07: 100 ug via INTRAVENOUS
  Filled 2017-02-07: qty 2

## 2017-02-07 MED ORDER — OXYTOCIN 40 UNITS IN LACTATED RINGERS INFUSION - SIMPLE MED: 2.5000 [IU]/h | INTRAVENOUS | Status: DC

## 2017-02-07 MED ORDER — LIDOCAINE HCL (PF) 1 % IJ SOLN
30.0000 mL | INTRAMUSCULAR | Status: DC | PRN
  Filled 2017-02-07: qty 30

## 2017-02-07 MED ORDER — LACTATED RINGERS IV SOLN: 500.0000 mL | Freq: Once | INTRAVENOUS | Status: DC

## 2017-02-07 MED ORDER — OXYCODONE-ACETAMINOPHEN 5-325 MG PO TABS: 1.0000 | ORAL_TABLET | ORAL | Status: DC | PRN

## 2017-02-07 MED ORDER — FENTANYL 2.5 MCG/ML BUPIVACAINE 1/10 % EPIDURAL INFUSION (WH - ANES)
INTRAMUSCULAR | Status: AC
  Filled 2017-02-07: qty 100

## 2017-02-07 MED ORDER — FENTANYL 2.5 MCG/ML BUPIVACAINE 1/10 % EPIDURAL INFUSION (WH - ANES)
14.0000 mL/h | INTRAMUSCULAR | Status: DC | PRN
  Administered 2017-02-07: 14 mL/h via EPIDURAL

## 2017-02-07 MED ORDER — SOD CITRATE-CITRIC ACID 500-334 MG/5ML PO SOLN: 30.0000 mL | ORAL | Status: DC | PRN

## 2017-02-07 MED ORDER — ONDANSETRON HCL 4 MG/2ML IJ SOLN: 4.0000 mg | Freq: Four times a day (QID) | INTRAMUSCULAR | Status: DC | PRN

## 2017-02-07 MED ORDER — MISOPROSTOL 200 MCG PO TABS
50.0000 ug | ORAL_TABLET | ORAL | Status: DC | PRN
  Administered 2017-02-07: 50 ug via ORAL
  Filled 2017-02-07: qty 1

## 2017-02-07 NOTE — Progress Notes (Signed)
   Angela Rangel is a 31 y.o. [phone] at 10731w0d  admitted for induction of labor due to cholestasis. .  Subjective: Patient resting well, comfortable with epidural.   Objective: Vitals:   02/07/17 0737 02/07/17 0808 02/07/17 0944 02/07/17 1214  BP: 110/66  101/68 101/65  Pulse: 61  69 71  Resp: 18  16 16   Temp: 98 F (36.7 C)  98.2 F (36.8 C) 98.1 F (36.7 C)  TempSrc: Oral  Oral Oral  Weight:  214 lb (97.1 kg)    Height:  5\' 5"  (1.651 m)     No intake/output data recorded.  FHT:  FHR: 135 bpm, variability: moderate,  accelerations:  Present,  decelerations:  Absent UC:   irregular, every 8 minutes SVE:   Dilation: 4 Effacement (%): 50 Station: -2 Exam by:: J.Thornton, RN  Pitocin @ 2 mu/min  Labs: Lab Results  Component Value Date   WBC 9.0 02/07/2017   HGB 12.3 02/07/2017   HCT 37.0 02/07/2017   MCV 85.5 02/07/2017   PLT 196 02/07/2017    Assessment / Plan: Induction of labor due to cholestasis. ,  progressing well on pitocin  Labor: Progressing on Pitocin, will continue to increase then AROM Fetal Wellbeing:  Category I Pain Control:  Epidural Anticipated MOD:  NSVD  Angela Rangel 02/07/2017, 12:34 PM

## 2017-02-07 NOTE — Anesthesia Pain Management Evaluation Note (Signed)
  CRNA Pain Management Visit Note  [name], 31 y.o., female  "Hello [facility] We have an anesthesia team available at all times to provide care throughout the hospital, including epidural management and anesthesia for C-section. I don't know your plan for the delivery whether it a natural birth, water birth, IV sedation, nitrous supplementation, doula or epidural, but we want to meet your pain goals."   1.Was your pain managed to your expectations on prior hospitalizations?   Yes   2.What is your expectation for pain management during this hospitalization?     planning natural  3.How can we help you reach that goal? I told patient to notify RN if she desires anything for pain.  Record the patient's initial score and the patient's pain goal.   Pain: 5  Pain Goal: 10 [facility] wants you to be able to say your pain was always managed very well.  Ruhee Enck 02/07/2017

## 2017-02-07 NOTE — Anesthesia Procedure Notes (Signed)
Epidural Patient location during procedure: OB Start time: 02/07/2017 9:55 PM End time: 02/07/2017 9:59 PM  Staffing Anesthesiologist: Leilani AbleHatchett, Gwynevere Lizana, MD Performed: anesthesiologist   Preanesthetic Checklist Completed: patient identified, site marked, surgical consent, pre-op evaluation, timeout performed, IV checked, risks and benefits discussed and monitors and equipment checked  Epidural Patient position: sitting Prep: site prepped and draped and DuraPrep Patient monitoring: continuous pulse ox and blood pressure Approach: midline Location: L3-L4 Injection technique: LOR air  Needle:  Needle type: Tuohy  Needle gauge: 17 G Needle length: 9 cm and 9 Needle insertion depth: 7 cm Catheter type: closed end flexible Catheter size: 19 Gauge Catheter at skin depth: 12 cm Test dose: negative and Other  Assessment Sensory level: T9 Events: blood not aspirated, injection not painful, no injection resistance, negative IV test and no paresthesia  Additional Notes Reason for block:procedure for pain

## 2017-02-07 NOTE — Anesthesia Preprocedure Evaluation (Signed)
Anesthesia Evaluation  Patient identified by MRN, date of birth, ID band Patient awake    Reviewed: Allergy & Precautions, H&P , NPO status , Patient's Chart, lab work & pertinent test results  Airway Mallampati: I  TM Distance: >3 FB Neck ROM: full    Dental no notable dental hx. (+) Teeth Intact   Pulmonary neg pulmonary ROS,    Pulmonary exam normal        Cardiovascular negative cardio ROS Normal cardiovascular exam Rhythm:Regular Rate:Normal     Neuro/Psych negative neurological ROS  negative psych ROS   GI/Hepatic negative GI ROS, Neg liver ROS,   Endo/Other  negative endocrine ROS  Renal/GU negative Renal ROS  negative genitourinary   Musculoskeletal negative musculoskeletal ROS (+)   Abdominal (+) + obese,   Peds  Hematology negative hematology ROS (+)   Anesthesia Other Findings   Reproductive/Obstetrics (+) Pregnancy                             Anesthesia Physical  Anesthesia Plan  ASA: II  Anesthesia Plan: Epidural   Post-op Pain Management:    Induction:   PONV Risk Score and Plan:   Airway Management Planned:   Additional Equipment:   Intra-op Plan:   Post-operative Plan:   Informed Consent: I have reviewed the patients History and Physical, chart, labs and discussed the procedure including the risks, benefits and alternatives for the proposed anesthesia with the patient or authorized representative who has indicated his/her understanding and acceptance.     Plan Discussed with:   Anesthesia Plan Comments:         Anesthesia Quick Evaluation

## 2017-02-07 NOTE — Progress Notes (Signed)
Labor Progress Note Pierre Balieresa Silva De Melara is a 31 y.o. [phone] at 2967w0d presented for IOL due to Cholestasis. S: Patient resting comfortably in bed. She does not have an epidural and does not wish to get one at this time. She denies headaches, visual changes or RUQ pain. No complaints at the moment  O:  BP (!) 84/64   Pulse 63   Temp 98.7 F (37.1 C) (Oral)   Resp 16   Ht 5\' 5"  (1.651 m)   Wt 214 lb (97.1 kg)   BMI 35.61 kg/m  EFM: 130/mod vari/+accels  CVE: Dilation: 5.5 Effacement (%): 60 Station: -1 Presentation: Vertex Exam by:: resident   A&P: 31 y.o. [id number] 4667w0d IOL due to Cholestasis. #Labor: Cervix continues to be  5.5/60/-1, was unable to feel a bag of water but head is well engaged. Will try again at 7pm. Continue with pitocin. #Pain: per patient request. She is managing her pain well at the moment. #FWB: cat 1 #GBS negative   Suella BroadKeriann S Jamaal Bernasconi, MD 6:09 PM

## 2017-02-07 NOTE — H&P (Signed)
Obstetric History and Physical Angela Rangel is a 31 y.o. [phone] with IUP at Unknown presenting for IOL for cholestasis. Admits to occasional contractions.  Prenatal Course Source of Care: HD Pregnancy complications or risks: Patient Active Problem List   Diagnosis Date Noted  . Encounter for induction of labor 02/07/2017  . Normal labor 03/27/2015   She plans to breastfeed, plans to bottle feed She desires IUD for postpartum contraception.   Prenatal labs and studies: ABO, Rh:  O pos Antibody:  neg Rubella:  immune RPR:   non reactive HBsAg:   neg HIV:   neg GBS: neg 1 hr Glucola  119 Genetic screening normal Anatomy US normal   Past Medical History:  Diagnosis Date  . Medical history non-contributory     Past Surgical History:  Procedure Laterality Date  . NO PAST SURGERIES      OB History  Gravida Para Term Preterm AB Living  4 3 3     3   SAB TAB Ectopic Multiple Live Births        0 3    # Outcome Date GA Lbr Len/2nd Weight Sex Delivery Anes PTL Lv  4 Current           3 Term 03/27/15 7742w5d 04:45 / 00:10 7 lb 2.3 oz (3.24 kg) M Vag-Spont EPI  LIV     Birth Comments: None noted  2 Term         LIV  1 Term         LIV      Social History   Socioeconomic History  . Marital status: Married    Spouse name: None  . Number of children: None  . Years of education: None  . Highest education level: None  Social Needs  . Financial resource strain: None  . Food insecurity - worry: None  . Food insecurity - inability: None  . Transportation needs - medical: None  . Transportation needs - non-medical: None  Occupational History  . None  Tobacco Use  . Smoking status: Never Smoker  Substance and Sexual Activity  . Alcohol use: No  . Drug use: No  . Sexual activity: Yes  Other Topics Concern  . None  Social History Narrative  . None    No family history on file.  Medications Prior to Admission  Medication Sig Dispense Refill Last Dose   . ibuprofen (ADVIL,MOTRIN) 600 MG tablet Take 1 tablet (600 mg total) by mouth every 6 (six) hours as needed. 50 tablet 1   . Prenatal Vit-Fe Fumarate-FA (PRENATAL MULTIVITAMIN) TABS tablet Take 1 tablet by mouth at bedtime.   03/26/2015 at Unknown time    No Known Allergies  Review of Systems: Negative except for what is mentioned in HPI.  Physical Exam: CONSTITUTIONAL: Well-developed, well-nourished female in no acute distress.  HENT:  Normocephalic, atraumatic, External right and left ear normal. Oropharynx is clear and moist EYES: Conjunctivae and EOM are normal. Pupils are equal, round, and reactive to light. No scleral icterus.  NECK: Normal range of motion, supple, no masses SKIN: Skin is warm and dry. No rash noted. Not diaphoretic. No erythema. No pallor. NEUROLOGIC: Alert and oriented to person, place, and time. Normal reflexes, muscle tone coordination. No cranial nerve deficit noted. PSYCHIATRIC: Normal mood and affect. Normal behavior. Normal judgment and thought content. CARDIOVASCULAR: Normal heart rate noted, regular rhythm RESPIRATORY: Effort and breath sounds normal, no problems with respiration noted ABDOMEN: Soft, nontender, nondistended, gravid. MUSCULOSKELETAL: Normal  range of motion. No edema and no tenderness. 2+ distal pulses.  Cervical Exam: Dilatation 4cm   Effacement 50%   Station -2   Presentation: cephalic FHT:  Baseline rate 150 bpm   Variability moderate  Accelerations present   Decelerations none    Assessment : Angela Rangel is a 31 y.o. [phone] at Unknown being admitted for induction of labor due to cholestasis.  Plan: Labor: Induction/Augmentation as ordered as per protocol. Analgesia as needed. FWB: Reassuring fetal heart tracing.  GBS negative Delivery plan: Hopeful for vaginal delivery MOF: both MOC: paraguard  Rolm BookbinderAmber Mitchell Iwanicki, DO

## 2017-02-08 LAB — COMPREHENSIVE METABOLIC PANEL
ALT: 30 U/L (ref 14–54)
AST: 25 U/L (ref 15–41)
Albumin: 2.3 g/dL — ABNORMAL LOW (ref 3.5–5.0)
Alkaline Phosphatase: 107 U/L (ref 38–126)
Anion gap: 4 — ABNORMAL LOW (ref 5–15)
BUN: 9 mg/dL (ref 6–20)
CHLORIDE: 105 mmol/L (ref 101–111)
CO2: 26 mmol/L (ref 22–32)
CREATININE: 0.59 mg/dL (ref 0.44–1.00)
Calcium: 8.5 mg/dL — ABNORMAL LOW (ref 8.9–10.3)
Glucose, Bld: 84 mg/dL (ref 65–99)
Potassium: 3.8 mmol/L (ref 3.5–5.1)
SODIUM: 135 mmol/L (ref 135–145)
Total Bilirubin: 0.5 mg/dL (ref 0.3–1.2)
Total Protein: 5 g/dL — ABNORMAL LOW (ref 6.5–8.1)

## 2017-02-08 LAB — CBC
HCT: 35.7 % — ABNORMAL LOW (ref 36.0–46.0)
Hemoglobin: 11.7 g/dL — ABNORMAL LOW (ref 12.0–15.0)
MCH: 28.6 pg (ref 26.0–34.0)
MCHC: 32.8 g/dL (ref 30.0–36.0)
MCV: 87.3 fL (ref 78.0–100.0)
PLATELETS: 183 10*3/uL (ref 150–400)
RBC: 4.09 MIL/uL (ref 3.87–5.11)
RDW: 14.4 % (ref 11.5–15.5)
WBC: 8.2 10*3/uL (ref 4.0–10.5)

## 2017-02-08 MED ORDER — ZOLPIDEM TARTRATE 5 MG PO TABS: 5.0000 mg | ORAL_TABLET | Freq: Every evening | ORAL | Status: DC | PRN

## 2017-02-08 MED ORDER — IBUPROFEN 600 MG PO TABS
600.0000 mg | ORAL_TABLET | Freq: Four times a day (QID) | ORAL | Status: DC
  Administered 2017-02-08 (×2): 600 mg via ORAL
  Filled 2017-02-08 (×4): qty 1

## 2017-02-08 MED ORDER — SIMETHICONE 80 MG PO CHEW: 80.0000 mg | CHEWABLE_TABLET | ORAL | Status: DC | PRN

## 2017-02-08 MED ORDER — TETANUS-DIPHTH-ACELL PERTUSSIS 5-2.5-18.5 LF-MCG/0.5 IM SUSP: 0.5000 mL | Freq: Once | INTRAMUSCULAR | Status: DC

## 2017-02-08 MED ORDER — DIBUCAINE 1 % RE OINT: 1.0000 "application " | TOPICAL_OINTMENT | RECTAL | Status: DC | PRN

## 2017-02-08 MED ORDER — LACTATED RINGERS IV BOLUS (SEPSIS)
1000.0000 mL | Freq: Once | INTRAVENOUS | Status: AC
  Administered 2017-02-08: 1000 mL via INTRAVENOUS

## 2017-02-08 MED ORDER — PRENATAL MULTIVITAMIN CH: 1.0000 | ORAL_TABLET | Freq: Every day | ORAL | Status: DC

## 2017-02-08 MED ORDER — COCONUT OIL OIL: 1.0000 "application " | TOPICAL_OIL | Status: DC | PRN

## 2017-02-08 MED ORDER — ONDANSETRON HCL 4 MG/2ML IJ SOLN
4.0000 mg | INTRAMUSCULAR | Status: DC | PRN
  Administered 2017-02-08: 4 mg via INTRAVENOUS
  Filled 2017-02-08 (×2): qty 2

## 2017-02-08 MED ORDER — BENZOCAINE-MENTHOL 20-0.5 % EX AERO: 1.0000 "application " | INHALATION_SPRAY | CUTANEOUS | Status: DC | PRN

## 2017-02-08 MED ORDER — WITCH HAZEL-GLYCERIN EX PADS: 1.0000 "application " | MEDICATED_PAD | CUTANEOUS | Status: DC | PRN

## 2017-02-08 MED ORDER — SENNOSIDES-DOCUSATE SODIUM 8.6-50 MG PO TABS
2.0000 | ORAL_TABLET | ORAL | Status: DC
  Administered 2017-02-08: 2 via ORAL
  Filled 2017-02-08: qty 2

## 2017-02-08 MED ORDER — DIPHENHYDRAMINE HCL 25 MG PO CAPS: 25.0000 mg | ORAL_CAPSULE | Freq: Four times a day (QID) | ORAL | Status: DC | PRN

## 2017-02-08 MED ORDER — ONDANSETRON HCL 4 MG PO TABS: 4.0000 mg | ORAL_TABLET | ORAL | Status: DC | PRN

## 2017-02-08 MED ORDER — ACETAMINOPHEN 325 MG PO TABS
650.0000 mg | ORAL_TABLET | ORAL | Status: DC | PRN
  Administered 2017-02-08: 650 mg via ORAL
  Filled 2017-02-08: qty 2

## 2017-02-08 NOTE — Progress Notes (Signed)
Patient just has a little dizzyness but nausea has subsided. And patient refused pain meds at this time. She states she is not in any pain per family in room. Mother told to call when she wanted medicine for pain.

## 2017-02-08 NOTE — Lactation Note (Addendum)
This note was copied from a baby's chart. Lactation Consultation Note  Patient Name: Angela Rangel Today's Date: 02/08/2017   Spanish interpreter [phone] used. Mother nauseous, not feeling well. Ex BF.  Baby 12 hours old, 37 weeks, finishing bath and has been spitty. Attempted bf but too sleepy.  Repositioned baby to cross cradle hold with no latch. Mother hand expressed good flow of colostrum which was given to baby on spoon but baby was not interested in feeding at this time. Mom encouraged to feed baby 8-12 times/24 hours and with feeding cues.  Lactation to follow up with mother and baby later today.       Maternal Data    Feeding    LATCH Score                   Interventions    Lactation Tools Discussed/Used     Consult Status      Hardie PulleyBerkelhammer, Chance Karam Boschen 02/08/2017, 11:17 AM

## 2017-02-08 NOTE — Accreditation Note (Signed)
Eta in to explain to patient that she is getting a bolus and we will monitor her nausea. LC notified that infant isn' t eating well.

## 2017-02-08 NOTE — Lactation Note (Signed)
This note was copied from a baby's chart. Lactation Consultation Note  Patient Name: Angela Rangel WUJWJ'XToday's Date: 02/08/2017 Reason for consult: Follow-up assessment   Interpreter [phone] for Spanish.   Ex Bf Baby 16 hours old.  37 weeks Baby has been very sleepy. Suggest to mother to either pump with DEBP, manual or formula. Mother chose to pump w/ manual pump.  She pumped approx 18 ml. Attempted to finger syringe feed baby but baby did not suck.  Noted short lingual frenulum. Recommend family discuss with Pediatrician. Attempted with slow flow nipple and baby sucked a few times with some difficulty. Baby took approx 4 ml. Baby latched in cradle position.  Sucks and swallows observed intermittently.  Mother very happy baby is latching. Reviewed milk storage. Mom encouraged to feed baby 8-12 times/24 hours and with feeding cues q 3 hours.  Will call if she decides she wants DEBP  Baby latched in cradle position.  Sucks and swallows observed intermittently. Mom made aware of O/P services, breastfeeding support groups, community resources, and our phone # for post-discharge questions.       Maternal Data    Feeding Feeding Type: Breast Fed  LATCH Score Latch: Repeated attempts needed to sustain latch, nipple held in mouth throughout feeding, stimulation needed to elicit sucking reflex.  Audible Swallowing: A few with stimulation  Type of Nipple: Everted at rest and after stimulation  Comfort (Breast/Nipple): Soft / non-tender  Hold (Positioning): Assistance needed to correctly position infant at breast and maintain latch.  LATCH Score: 7  Interventions Interventions: Breast feeding basics reviewed;Assisted with latch;Skin to skin;Breast massage;Hand express;Breast compression;Adjust position;Support pillows;Expressed milk;Hand pump  Lactation Tools Discussed/Used     Consult Status Consult Status: Follow-up Date: 02/09/17 Follow-up type:  In-patient    Dahlia ByesBerkelhammer, Krithi Bray [facility]os[facility] 02/08/2017, 2:42 PM

## 2017-02-08 NOTE — Progress Notes (Signed)
Post Partum Day 1  Subjective:  Angela Rangel is a 31 y.o. [phone] 3850w0d s/p SVD.  No acute events overnight.  Patient endorsing headache that comes and goes. Also having abdominal cramping. Pt denies problems with ambulating, voiding or po intake.  She denies nausea or vomiting.  Pain is moderately controlled.  Lochia Minimal.  Plan for birth control is IUD.  Method of Feeding: Breast.  Objective: BP 96/62 (BP Location: Left Arm)   Pulse 64   Temp 98.4 F (36.9 C) (Oral)   Resp 20   Ht 5\' 5"  (1.651 m)   Wt 97.1 kg (214 lb)   SpO2 99%   Breastfeeding? Unknown   BMI 35.61 kg/m   Physical Exam:  General: alert, cooperative and no distress Lochia:normal flow Chest: normal work of breathing Heart: regular rate Abdomen: soft, nontender Uterine Fundus: firm DVT Evaluation: No evidence of DVT seen on physical exam.   Recent Labs    02/07/17 0749  HGB 12.3  HCT 37.0    Assessment/Plan:  ASSESSMENT: Angela Rangel is a 31 y.o. G4P4004 2150w0d ppd #1 s/p NSVD doing well.   Plan for discharge tomorrow Continue routine PP care Anesthesia to assess headache Lactation consultation   LOS: 1 day   Caryl AdaJazma Phelps, DO 02/08/2017, 9:16 AM

## 2017-02-08 NOTE — Anesthesia Postprocedure Evaluation (Signed)
**Note Angela-Identified via Obfuscation** Anesthesia Post Note  [name] Angela Rangel  Procedure(s) Performed: AN AD HOC LABOR EPIDURAL     Patient location during evaluation: [facility] Anesthesia Type: Epidural Level of consciousness: awake, awake and alert and oriented Pain management: pain level controlled Vital Signs Assessment: post-procedure vital signs reviewed and stable Respiratory status: spontaneous breathing and nonlabored ventilation Cardiovascular status: stable Postop Assessment: no headache, adequate PO intake, no backache, epidural receding, patient able to bend at knees and no apparent nausea or vomiting Anesthetic complications: no    Last Vitals:  Vitals:   02/08/17 0615 02/08/17 1116  BP: 96/62 (!) 86/67  Pulse: 64 61  Resp:  18  Temp: 36.9 C 36.8 C  SpO2: 99% 98%    Last Pain:  Vitals:   02/08/17 1116  TempSrc: Oral  PainSc:    Pain Goal:                 [name],[name]

## 2017-02-08 NOTE — Progress Notes (Signed)
Patient still denies any headaches per family member and was told to notifiy RN if she felt they were coming back. Patient states she has had some soup and has kept it down without Emesis. Resident aware of patient's concerns will order IV meds since patient is still slightly Nauseated. Lochia small no clots noted.

## 2017-02-08 NOTE — Progress Notes (Signed)
Eta the interpreter in to go over plan of care and answer all questions for RN. CRNA in assessed patient . Patient complains of a headache blood patch explained. Pain meds given patient told to call out if pain is still a 10 out of 10 after pain medication. Patient told Interpreter that she did not get someone in overnight to explain things to her. Patient's legs are weak patient told to call out for help . Pain regimen explained.

## 2017-02-08 NOTE — Progress Notes (Signed)
Bolus completed. Patient does not want any  Nausea medicine and feels better. Patient breastfeeding now. Patient told once she eats some solid food I will give her medicine. Patient looks better.

## 2017-02-08 NOTE — Lactation Note (Signed)
This note was copied from a baby's chart. Lactation Consultation Note Baby 3 hours old. Mom Spanish, used interpreter [phone].  Mom BF her 31 yr old for 2 yrs, her 386 yr old for 3 yrs and her 31 yr old for 1 yr. Denies difficulty BF this baby. Has no questions and needs no assistance w/BF. Reviewed cluster feeding, importance of STS and I&O.  Mom is Breast feeding/formula feeding. Encouraged to BF first and avoid formula if possible until milk supply is in 3-5 days. Mom has WIC. Encouraged to call for questions or assistance. WH/LC brochure given w/resources, support groups and LC services.  Patient Name: Angela Rangel Today's Date: 02/08/2017 Reason for consult: Initial assessment   Maternal Data Formula Feeding for Exclusion: Yes Reason for exclusion: Mother's choice to formula and breast feed on admission Does the patient have breastfeeding experience prior to this delivery?: Yes  Feeding Feeding Type: Breast Fed Length of feed: 5 min  LATCH Score Latch: Grasps breast easily, tongue down, lips flanged, rhythmical sucking.  Audible Swallowing: Spontaneous and intermittent  Type of Nipple: Everted at rest and after stimulation  Comfort (Breast/Nipple): Soft / non-tender  Hold (Positioning): No assistance needed to correctly position infant at breast.  LATCH Score: 10  Interventions Interventions: Breast feeding basics reviewed  Lactation Tools Discussed/Used     Consult Status Consult Status: PRN Date: 02/09/17 Follow-up type: In-patient    Janki Dike, Diamond NickelLAURA G 02/08/2017, 2:08 AM

## 2017-02-08 NOTE — Progress Notes (Signed)
Patient called out to go to bathroom. NT took her and I was told she threw up. Told patient to just relax and not to eat anything until her nausea subsides. Patient had just had breakfast.

## 2017-02-08 NOTE — Progress Notes (Signed)
Resident on call called and updated on BP of 86/67 pulse 61. Phone interpreter was being used by lactation. I asked if patient had a headache anymore and she said no just nauseated and pain and light headed. Resident will come to evaluate patient.

## 2017-02-09 MED ORDER — IBUPROFEN 600 MG PO TABS: 600.0000 mg | ORAL_TABLET | Freq: Four times a day (QID) | ORAL | 3 refills | Status: DC | PRN

## 2017-02-09 NOTE — Progress Notes (Signed)
DC teaching completed via language line. All questions answered. Papers in spanish given.

## 2017-02-09 NOTE — Discharge Summary (Signed)
OB Discharge Summary     Patient Name: Angela Rangel DOB: [birth year] MRN: [id number]  Date of admission: 02/07/2017 Delivering MD: Pincus LargePHELPS, JAZMA Y   Date of discharge: 02/09/2017  Admitting diagnosis: 37wls Induction Intrauterine pregnancy: 8127w0d     Secondary diagnosis:  Active Problems:   Encounter for induction of labor   Cholestasis during pregnancy in third trimester   SVD (spontaneous vaginal delivery)  Additional problems: None     Discharge diagnosis: Term Pregnancy Delivered and Cholestasis                                                                                                Post partum procedures:None  Augmentation: AROM, Pitocin and Cytotec  Complications: None  Hospital course:  Induction of Labor With Vaginal Delivery   31 y.o. yo [id number] at 4027w0d was admitted to the hospital 02/07/2017 for induction of labor.  Indication for induction: Cholestasis of pregnancy.  Patient had an uncomplicated labor course as follows: Membrane Rupture Time/Date: 7:18 PM ,02/07/2017   Intrapartum Procedures: Episiotomy: None [1]                                         Lacerations:  None [1]  Patient had delivery of a Viable infant.  Information for the patient's newborn:  Anastasia FiedlerSilva De Rangel, Girl [name] [[id number]][[id number]]  Delivery Method: Vaginal, Spontaneous(Filed from Delivery Summary)   02/07/2017  Details of delivery can be found in separate delivery note.  Patient had a routine postpartum course. Patient is discharged home 02/09/17.  Physical exam  Vitals:   02/08/17 1116 02/08/17 1451 02/08/17 1837 02/09/17 0626  BP: (!) 86/67 126/78 (!) 101/56 109/70  Pulse: 61 85 61 (!) 59  Resp: 18  18 16   Temp: 98.2 F (36.8 C)  98.4 F (36.9 C) 97.7 F (36.5 C)  TempSrc: Oral  Oral Oral  SpO2: 98%   97%  Weight:      Height:       General: alert, cooperative and no distress Lochia: appropriate Uterine Fundus: firm DVT Evaluation: No evidence of DVT seen on  physical exam. Negative Homan's sign. No cords or calf tenderness. Labs: Lab Results  Component Value Date   WBC 8.2 02/08/2017   HGB 11.7 (L) 02/08/2017   HCT 35.7 (L) 02/08/2017   MCV 87.3 02/08/2017   PLT 183 02/08/2017   CMP Latest Ref Rng & Units 02/08/2017  Glucose 65 - 99 mg/dL 84  BUN 6 - 20 mg/dL 9  Creatinine 1.300.44 - 8.651.00 mg/dL 7.840.59  Sodium 696135 - 295145 mmol/L 135  Potassium 3.5 - 5.1 mmol/L 3.8  Chloride 101 - 111 mmol/L 105  CO2 22 - 32 mmol/L 26  Calcium 8.9 - 10.3 mg/dL 2.8(U8.5(L)  Total Protein 6.5 - 8.1 g/dL 5.0(L)  Total Bilirubin 0.3 - 1.2 mg/dL 0.5  Alkaline Phos 38 - 126 U/L 107  AST 15 - 41 U/L 25  ALT 14 - 54 U/L 30    Discharge instruction:  per After Visit Summary and "Baby and Me Booklet".  After visit meds:  Allergies as of 02/09/2017   No Known Allergies     Medication List    TAKE these medications   ibuprofen 600 MG tablet Commonly known as:  ADVIL,MOTRIN Take 1 tablet (600 mg total) every 6 (six) hours as needed by mouth for moderate pain or cramping.   prenatal multivitamin Tabs tablet Take 1 tablet by mouth at bedtime.       Diet: routine diet  Activity: Advance as tolerated. Pelvic rest for 6 weeks.   [facility]  Postpartum contraception: IUD Mirena  Newborn Data: Live born female  Birth Weight: 6 lb 4.9 oz (2860 g) APGAR: 9, 9  Newborn Delivery   Birth date/time:  02/07/2017 22:35:00 Delivery type:  Vaginal, Spontaneous     Baby Feeding: Bottle and Breast Disposition:home with mother   02/09/2017 Jaynie CollinsUgonna Marissah Vandemark, MD

## 2017-02-09 NOTE — Progress Notes (Signed)
Eta interpreter in went over plan of care and explained TSB results to patient. Patient states she feels good no nausea or headaches.

## 2017-02-09 NOTE — Discharge Instructions (Signed)
Parto vaginal, cuidados posteriores  (Vaginal Delivery, Care After)  Siga estas instrucciones durante las próximas semanas. Estas indicaciones le proporcionan información acerca de cómo deberá cuidarse después del parto. El médico también podrá darle instrucciones más específicas. El tratamiento ha sido planificado según las prácticas médicas actuales, pero en algunos casos pueden ocurrir problemas. Llame al médico si tiene problemas o preguntas.  QUÉ ESPERAR DESPUÉS DEL PARTO  Después de un parto vaginal, es frecuente tener lo siguiente:  · Hemorragia leve de la vagina.  · Dolor en el abdomen, la vagina y la zona de la piel entre la abertura vaginal y el ano (perineo).  · Calambres pélvicos.  · Fatiga.  INSTRUCCIONES PARA EL CUIDADO EN EL HOGAR  Medicamentos  · Tome los medicamentos de venta libre y los recetados solamente como se lo haya indicado el médico.  · Si le recetaron un antibiótico, tómelo como se lo haya indicado el médico. No interrumpa la administración del antibiótico hasta que lo haya terminado.  Conducir  · No conduzca ni opere maquinaria pesada mientras toma analgésicos recetados.  · No conduzca durante 24 horas si le administraron un sedante.  Estilo de vida  · No beba alcohol. Esto es de suma importancia si está amamantando o toma analgésicos.  · No consuma productos que contengan tabaco, incluidos cigarrillos, tabaco de mascar o cigarrillos electrónicos. Si necesita ayuda para dejar de fumar, consulte al médico.  Comida y bebida  · Beba al menos 8 vasos de 8 onzas (240 cc) de agua todos los días a menos que el médico le indique lo contrario. Si elige amamantar al bebé, quizá deba beber aún más cantidad de agua.  · Ingiera alimentos ricos en fibras todos los días. Estos alimentos pueden ayudarla a prevenir o aliviar el estreñimiento. Los alimentos ricos en fibras incluyen, entre otros:  ? Panes y cereales integrales.  ? Arroz integral.  ? Frijoles.  ? Frutas y verduras  frescas.  Actividad  · Reanude sus actividades normales como se lo haya indicado el médico. Pregúntele al médico qué actividades son seguras para usted.  · Descanse todo lo que pueda. Trate de descansar o tomar una siesta mientras el bebé está durmiendo.  · No levante objetos que pesen más de 10 libras (4,5 kg) hasta que el médico le diga que es seguro hacerlo.  · Hable con el médico sobre cuándo puede volver a tener relaciones sexuales. Esto puede depender de lo siguiente:  ? Riesgo de sufrir infecciones.  ? Velocidad de cicatrización.  ? Comodidad y deseo de tener relaciones sexuales.  Cuidados vaginales  · Si le realizaron una episiotomía o tuvo un desgarro vaginal, contrólese la zona todos los días para detectar signos de infección. Esté atenta a los siguientes signos:  ? Aumento del enrojecimiento, la hinchazón o el dolor.  ? Más líquido o sangre.  ? Calor.  ? Pus o mal olor.  · No use tampones ni se haga duchas vaginales hasta que el médico la autorice.  · Controle la sangre que elimina por la vagina para detectar coágulos. Pueden tener el aspecto de grumos de color rojo oscuro, marrón o negro.  Instrucciones generales  · Mantenga el perineo limpio y seco, como se lo haya indicado el médico.  · Use ropa cómoda y suelta.  · Cuando vaya al baño, siempre higienícese de adelante hacia atrás.  · Pregúntele al médico si puede ducharse o tomar baños de inmersión. Si se le realizó una episiotomía o tuvo un desgarro perineal durante el trabajo del parto o   el parto, es posible que el médico le indique que no tome baños de inmersión durante un determinado tiempo.  · Use un sostén que sujete y ajuste bien sus pechos.  · Si es posible, pídale a alguien que la ayude con las tareas del hogar y a cuidar del bebé durante al menos algunos días después de salir del hospital.  · Concurra a todas las visitas de seguimiento para usted y el bebé, como se lo haya indicado el médico. Esto es importante.  SOLICITE ATENCIÓN MÉDICA  SI:  · Tiene los siguientes síntomas:  ? Secreción vaginal que tiene mal olor.  ? Dificultad para orinar.  ? Dolor al orinar.  ? Aumento o disminución repentinos de la frecuencia con que defeca.  ? Más enrojecimiento, hinchazón o dolor alrededor de la episiotomía o del desgarro vaginal.  ? Más secreción de líquido o sangre de la episiotomía o desgarro vaginal.  ? Pus o mal olor proveniente de la episiotomía o el desgarro vaginal.  ? Fiebre.  ? Erupción cutánea.  ? Poco interés o falta de interés en actividades que solían gustarle.  ? Dudas sobre su cuidado y el del bebé.  · Siente la episiotomía o el desgarro vaginal caliente al tacto.  · La episiotomía o el desgarro vaginal se está abriendo o no parece cicatrizar.  · Siente dolor en las mamas, o están duras o enrojecidas.  · Siente tristeza o preocupación de forma inusual.  · Siente náuseas o vomita.  · Elimina coágulos grandes por la vagina. Si expulsa un coágulo sanguíneo por la vagina, guárdelo para mostrárselo a su médico. No tire la cadena sin que el médico examine el coágulo antes.  · Orina más de lo habitual.  · Se siente mareada o se desmaya.  · No ha amamantado para nada y no ha tenido un período menstrual durante 12 semanas después del parto.  · Dejó de amamantar al bebé y no ha tenido su período menstrual durante 12 semanas después de dejar de amamantar.    SOLICITE ATENCIÓN MÉDICA DE INMEDIATO SI:  · Tiene los siguientes síntomas:  ? Dolor que no desaparece o no mejora con el medicamento.  ? Dolor en el pecho.  ? Dificultad para respirar.  ? Visión borrosa o manchas en la vista.  ? Pensamientos de autolesionarse o lesionar al bebé.  · Comienza a sentir dolor en el abdomen o en una de las piernas.  · Dolor de cabeza intenso.  · Se desmaya.  · Tiene una hemorragia tan intensa de la vagina que empapa dos toallitas sanitarias en una hora.    Esta información no tiene como fin reemplazar el consejo del médico. Asegúrese de hacerle al médico cualquier  pregunta que tenga.  Document Released: 03/13/2005 Document Revised: 07/05/2015 Document Reviewed: 03/28/2015  Elsevier Interactive Patient Education © 2017 [employer]

## 2017-02-09 NOTE — Lactation Note (Addendum)
This note was copied from a baby's chart. Lactation Consultation Note  Patient Name: Angela Rangel WUJWJ'XToday's Date: 02/09/2017 Reason for consult: Follow-up assessment;Early term 37-38.6wks   Follow up with mom of 36 hour old infant. Spoke with mother with assistance of [facility] Phone Spanish [employer]terpreter Ernesto # [phone]. Infant with 9 BF for 10-20 minutes, EBM x 1 of 15 cc, 3 voids and 3 stools in last 24 hours. LATCH scores 7-8. Infant weight 5 lb 14.9 oz with 6% weight loss since birth.   Mom reports infant is feeding much better. Mom is feeling fuller today. Mom reports she has some nipple soreness with initial latch. Discussed nipple care with mom. Mom has pumped x 2 with manual pump and obtained 15-18 cc. Enc mom to supplement infant with all EBM that is pump post BF due to being early term, enc mom to supplement after each BF.   Reviewed I/O, signs of dehydration in the NB, Engorgement prevention/treatment and breast milk expression and storage. Mom has manual pump for home use. Mom reports she has no questions/concerns at this time.      Maternal Data Has patient been taught Hand Expression?: Yes Does the patient have breastfeeding experience prior to this delivery?: Yes  Feeding    LATCH Score                   Interventions    Lactation Tools Discussed/Used     Consult Status Consult Status: Complete Follow-up type: Call as needed    Ed BlalockSharon S Anselma Herbel 02/09/2017, 11:23 AM

## 2019-08-25 ENCOUNTER — Other Ambulatory Visit: Payer: Self-pay

## 2019-08-25 ENCOUNTER — Observation Stay (HOSPITAL_BASED_OUTPATIENT_CLINIC_OR_DEPARTMENT_OTHER)
Admission: EM | Admit: 2019-08-25 | Discharge: 2019-08-27 | Disposition: A | Discharge status: Home / Self Care | Payer: Self-pay | Attending: Physician Assistant | Admitting: Physician Assistant

## 2019-08-25 ENCOUNTER — Emergency Department (HOSPITAL_BASED_OUTPATIENT_CLINIC_OR_DEPARTMENT_OTHER): Payer: Self-pay

## 2019-08-25 ENCOUNTER — Encounter (HOSPITAL_BASED_OUTPATIENT_CLINIC_OR_DEPARTMENT_OTHER): Payer: Self-pay | Admitting: *Deleted

## 2019-08-25 DIAGNOSIS — Z20822 Contact with and (suspected) exposure to covid-19: Secondary | ICD-10-CM | POA: Insufficient documentation

## 2019-08-25 DIAGNOSIS — K353 Acute appendicitis with localized peritonitis, without perforation or gangrene: Secondary | ICD-10-CM

## 2019-08-25 DIAGNOSIS — R1030 Lower abdominal pain, unspecified: Secondary | ICD-10-CM

## 2019-08-25 DIAGNOSIS — E876 Hypokalemia: Secondary | ICD-10-CM | POA: Insufficient documentation

## 2019-08-25 DIAGNOSIS — Z91040 Latex allergy status: Secondary | ICD-10-CM | POA: Insufficient documentation

## 2019-08-25 DIAGNOSIS — K3580 Unspecified acute appendicitis: Principal | ICD-10-CM | POA: Diagnosis present

## 2019-08-25 DIAGNOSIS — K760 Fatty (change of) liver, not elsewhere classified: Secondary | ICD-10-CM | POA: Insufficient documentation

## 2019-08-25 LAB — COMPREHENSIVE METABOLIC PANEL
ALT: 21 U/L (ref 0–44)
AST: 17 U/L (ref 15–41)
Albumin: 3.5 g/dL (ref 3.5–5.0)
Alkaline Phosphatase: 55 U/L (ref 38–126)
Anion gap: 10 (ref 5–15)
BUN: 11 mg/dL (ref 6–20)
CO2: 22 mmol/L (ref 22–32)
Calcium: 8.7 mg/dL — ABNORMAL LOW (ref 8.9–10.3)
Chloride: 106 mmol/L (ref 98–111)
Creatinine, Ser: 0.84 mg/dL (ref 0.44–1.00)
GFR calc Af Amer: >60 mL/min (ref >60)
GFR calc non Af Amer: >60 mL/min (ref >60)
Glucose, Bld: 125 mg/dL — ABNORMAL HIGH (ref 70–99)
Potassium: 3.3 mmol/L — ABNORMAL LOW (ref 3.5–5.1)
Sodium: 138 mmol/L (ref 135–145)
Total Bilirubin: 0.4 mg/dL (ref 0.3–1.2)
Total Protein: 7.1 g/dL (ref 6.5–8.1)

## 2019-08-25 LAB — CBC WITH DIFFERENTIAL/PLATELET
Abs Immature Granulocytes: 0.03 10*3/uL (ref 0.00–0.07)
Basophils Absolute: 0 10*3/uL (ref 0.0–0.1)
Basophils Relative: 0 %
Eosinophils Absolute: 0.2 10*3/uL (ref 0.0–0.5)
Eosinophils Relative: 2 %
HCT: 37.9 % (ref 36.0–46.0)
Hemoglobin: 12.7 g/dL (ref 12.0–15.0)
Immature Granulocytes: 0 %
Lymphocytes Relative: 19 %
Lymphs Abs: 2.2 10*3/uL (ref 0.7–4.0)
MCH: 29 pg (ref 26.0–34.0)
MCHC: 33.5 g/dL (ref 30.0–36.0)
MCV: 86.5 fL (ref 80.0–100.0)
Monocytes Absolute: 0.8 10*3/uL (ref 0.1–1.0)
Monocytes Relative: 7 %
Neutro Abs: 8.4 10*3/uL — ABNORMAL HIGH (ref 1.7–7.7)
Neutrophils Relative %: 72 %
Platelets: 242 10*3/uL (ref 150–400)
RBC: 4.38 MIL/uL (ref 3.87–5.11)
RDW: 13.5 % (ref 11.5–15.5)
WBC: 11.6 10*3/uL — ABNORMAL HIGH (ref 4.0–10.5)
nRBC: 0 % (ref 0.0–0.2)

## 2019-08-25 LAB — URINALYSIS, MICROSCOPIC (REFLEX)

## 2019-08-25 LAB — URINALYSIS, ROUTINE W REFLEX MICROSCOPIC
Bilirubin Urine: NEGATIVE
Glucose, UA: NEGATIVE mg/dL
Ketones, ur: NEGATIVE mg/dL
Leukocytes,Ua: NEGATIVE
Nitrite: NEGATIVE
Protein, ur: NEGATIVE mg/dL
Specific Gravity, Urine: 1.02 (ref 1.005–1.030)
pH: 8.5 — ABNORMAL HIGH (ref 5.0–8.0)

## 2019-08-25 LAB — PREGNANCY, URINE: Preg Test, Ur: NEGATIVE

## 2019-08-25 LAB — LIPASE, BLOOD: Lipase: 33 U/L (ref 11–51)

## 2019-08-25 IMAGING — CT CT ABD-PELV W/ CM
2 of 4 series · 16 of 46 positions shown, 18 images · IV contrast (Omnipaque)
Comparison: None.

CLINICAL DATA: 33-year-old female with lower abdominal pain.
Concern for acute diverticulitis.

EXAM:
CT ABDOMEN AND PELVIS WITH CONTRAST
TECHNIQUE: Multidetector CT imaging of the abdomen and pelvis was performed
using the standard protocol following bolus administration of
intravenous contrast.
CONTRAST:  100mL OMNIPAQUE IOHEXOL 300 MG/ML  SOLN

[Series 2: axial st · axial · 0.76mm/px · z∈[-669,-229]mm · 13 of 97 slices shown, 15 images]
[im 5/97  soft-tissue]
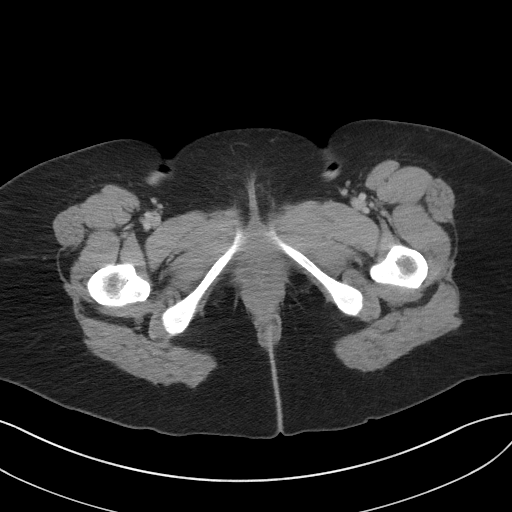
[im 5/97  bone]
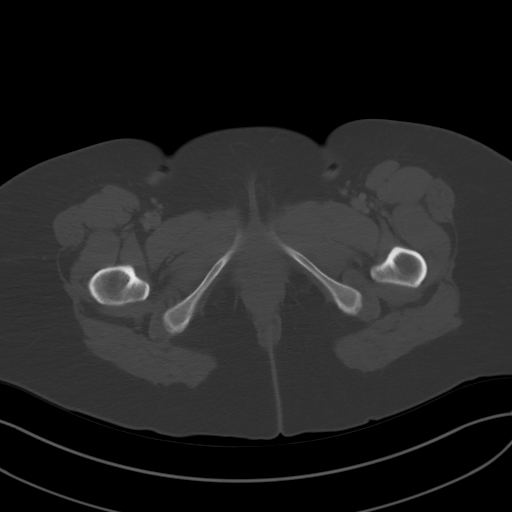
[im 13/97  soft-tissue]
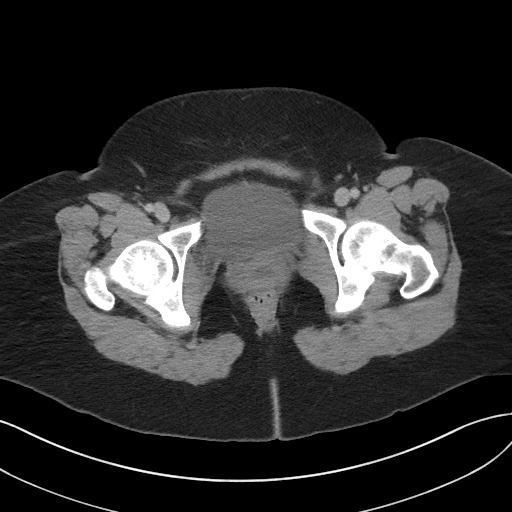
[im 21/97  soft-tissue]
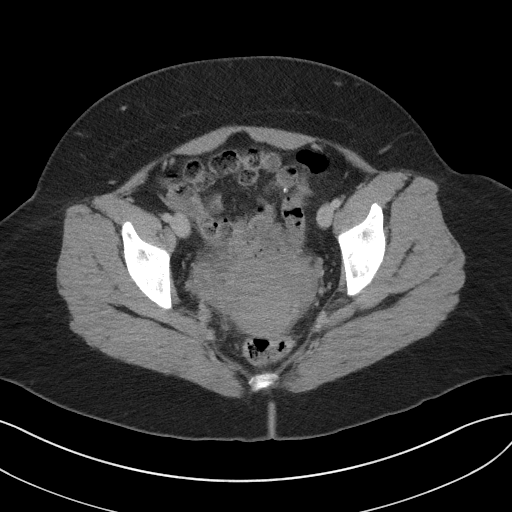
[im 29/97  soft-tissue]
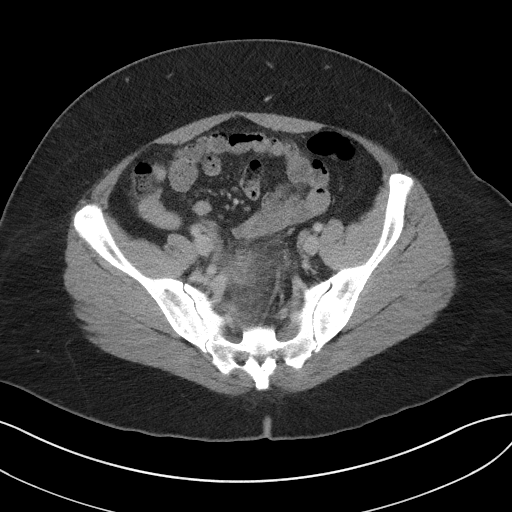
[im 33/97  soft-tissue]
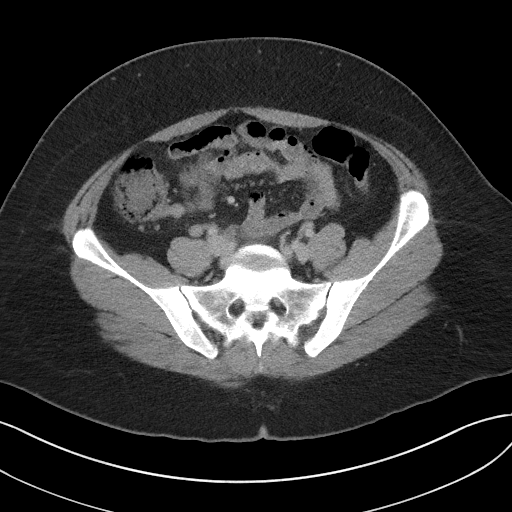
[im 41/97  soft-tissue]
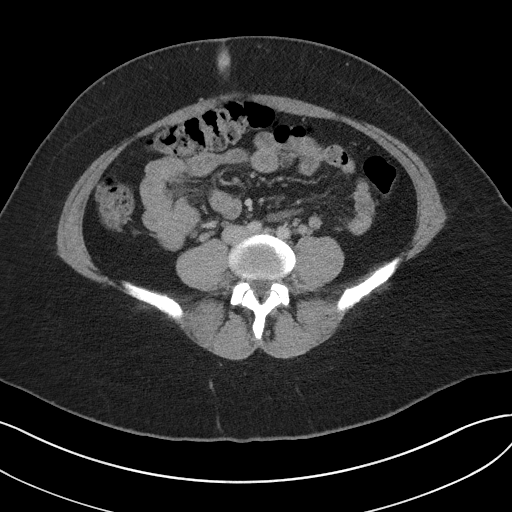
[im 49/97  soft-tissue]
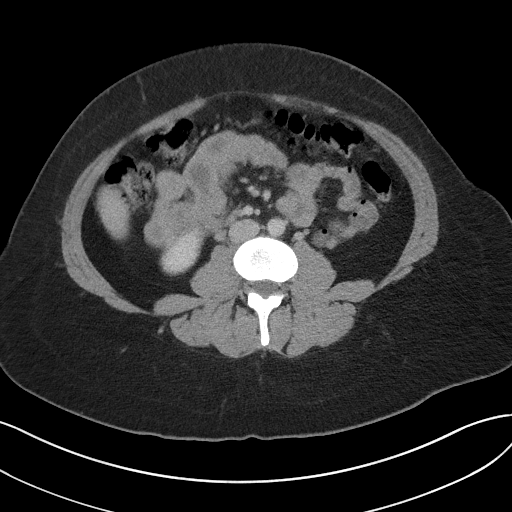
[im 57/97  soft-tissue]
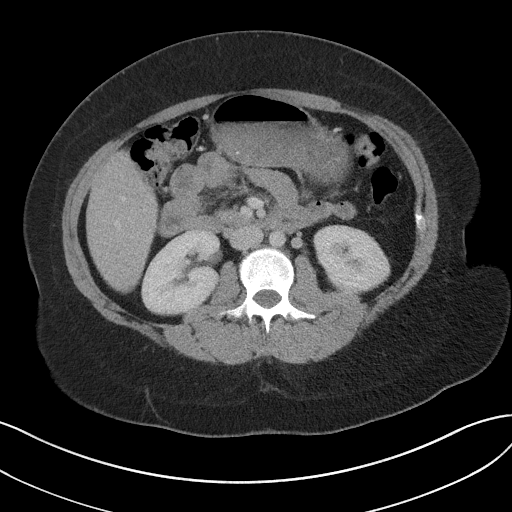
[im 65/97  soft-tissue]
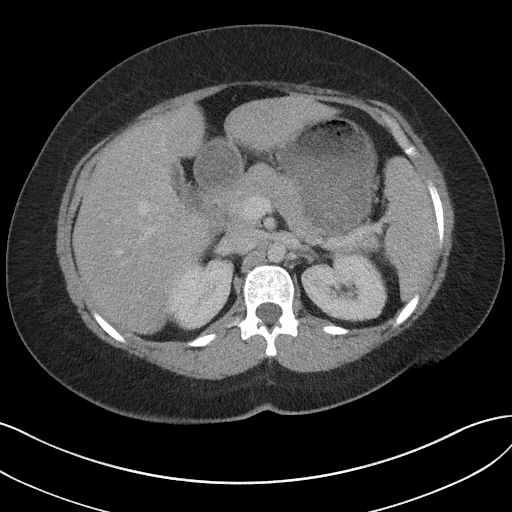
[im 65/97  bone]
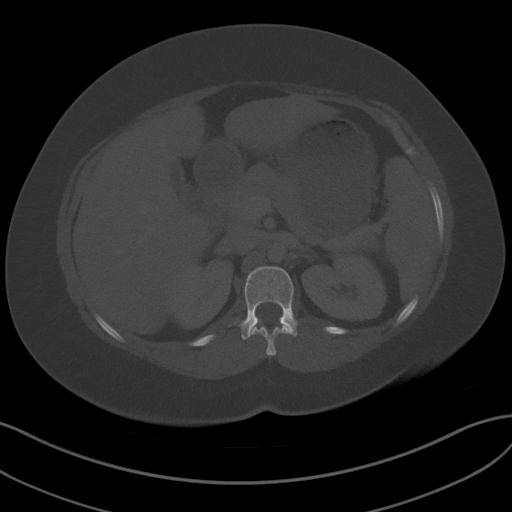
[im 69/97  soft-tissue]
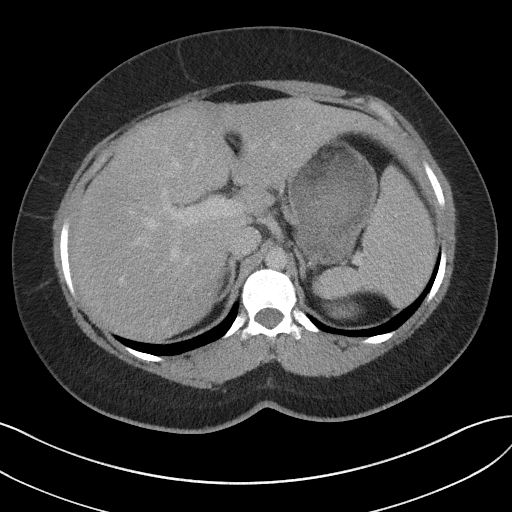
[im 77/97  soft-tissue]
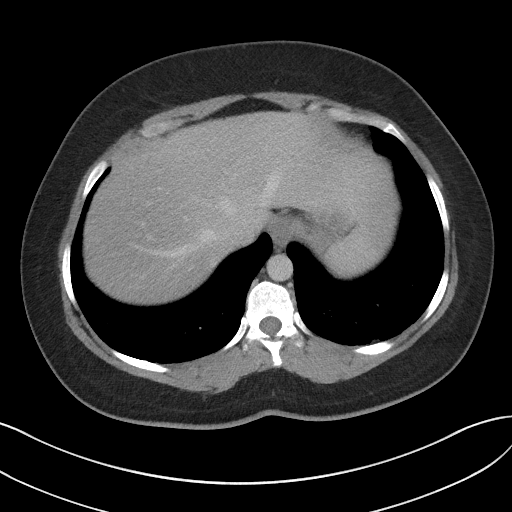
[im 85/97  soft-tissue]
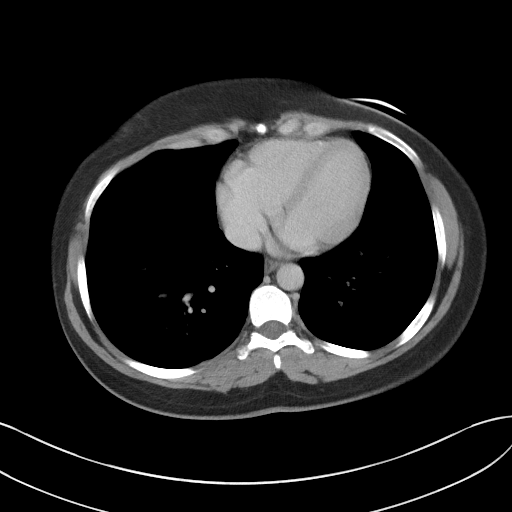
[im 93/97  soft-tissue]
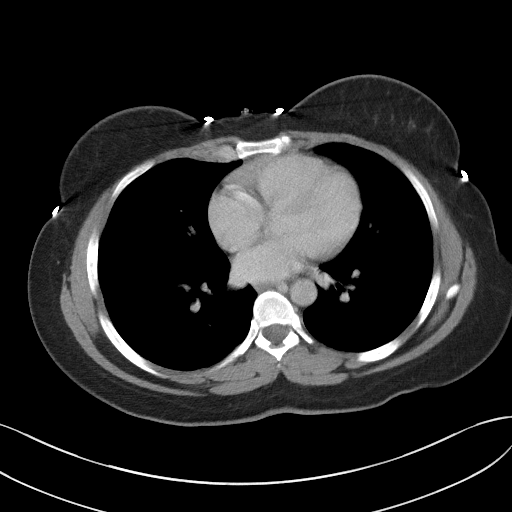

[Series 5: coronal st · coronal · 0.69mm/px · 3 of 110 slices shown]
[im 37/110  soft-tissue]
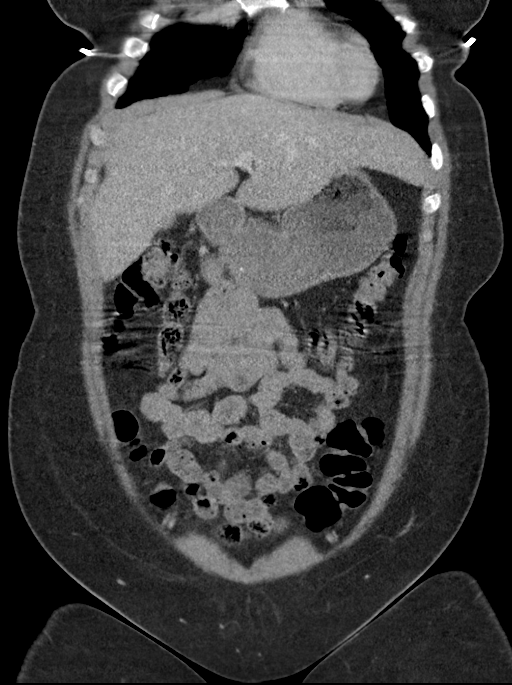
[im 49/110  soft-tissue]
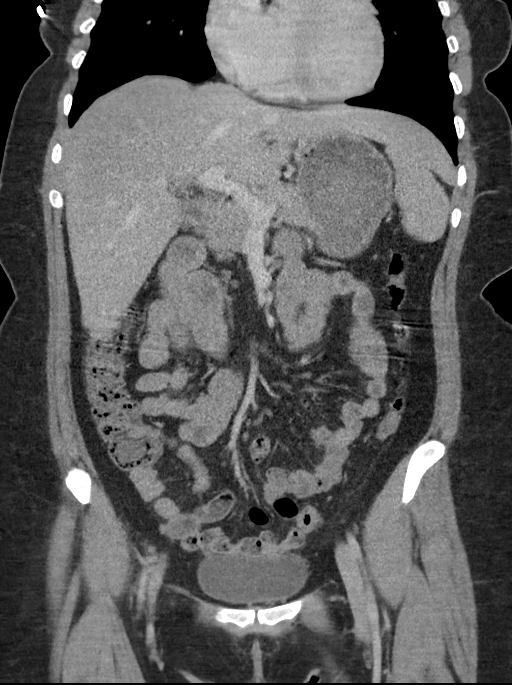
[im 61/110  soft-tissue]
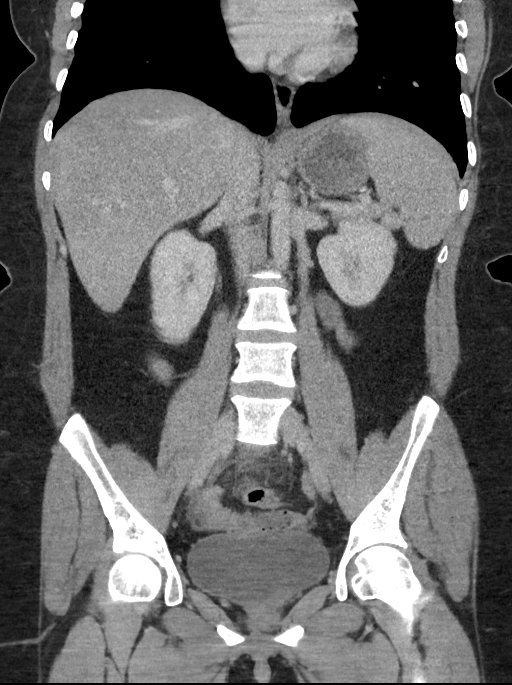

[16 of 46 positions shown; findings below may reference images not displayed]

FINDINGS: Lower chest: The visualized lung bases are clear.

No intra-abdominal free air or free fluid.

Hepatobiliary: Fatty infiltration of the liver. No intrahepatic
biliary ductal dilatation. The gallbladder is unremarkable.

Pancreas: Unremarkable. No pancreatic ductal dilatation or
surrounding inflammatory changes.

Spleen: Normal in size without focal abnormality.

Adrenals/Urinary Tract: The adrenal glands are unremarkable. The
kidneys, visualized ureters, and urinary bladder appear
unremarkable.

Stomach/Bowel: There is a 10 x 13 mm focus of inflammation within
the posterior pelvis to the right of the midline (70/2 and 68/5).
This inflammatory focus is located at the tip of the appendix. This
may represent a tip appendicitis with possible rupture or
phlegmonous changes or an epiploic appendagitis. Clinical
correlation is recommended. No drainable fluid collection. There is
no bowel obstruction.

Vascular/Lymphatic: The abdominal aorta and IVC are unremarkable. No
portal venous gas. There is no adenopathy.

Reproductive: The uterus is retroverted or retroflexed. No adnexal
masses.

Other: None

Musculoskeletal: No acute or significant osseous findings.
IMPRESSION: 1. Inflammatory changes of the pelvis centered around a focus
adjacent to the tip of the appendix. Findings may represent tip
appendicitis or an epiploic appendagitis. Clinical correlation is
recommended. No drainable fluid collection.
2. Fatty liver.

## 2019-08-25 MED ORDER — IOHEXOL 300 MG/ML  SOLN
100.0000 mL | Freq: Once | INTRAMUSCULAR | Status: AC
  Administered 2019-08-25: 100 mL via INTRAVENOUS

## 2019-08-25 MED ORDER — SODIUM CHLORIDE 0.9 % IV BOLUS
1000.0000 mL | Freq: Once | INTRAVENOUS | Status: AC
  Administered 2019-08-25: 1000 mL via INTRAVENOUS

## 2019-08-25 MED ORDER — ACETAMINOPHEN 325 MG PO TABS
650.0000 mg | ORAL_TABLET | Freq: Once | ORAL | Status: AC
  Administered 2019-08-25: 650 mg via ORAL
  Filled 2019-08-25: qty 2

## 2019-08-25 MED ORDER — FENTANYL CITRATE (PF) 100 MCG/2ML IJ SOLN
50.0000 ug | Freq: Once | INTRAMUSCULAR | Status: AC
  Administered 2019-08-25: 50 ug via INTRAVENOUS
  Filled 2019-08-25: qty 2

## 2019-08-25 MED ORDER — ONDANSETRON HCL 4 MG/2ML IJ SOLN
4.0000 mg | Freq: Once | INTRAMUSCULAR | Status: AC
  Administered 2019-08-25: 4 mg via INTRAVENOUS
  Filled 2019-08-25: qty 2

## 2019-08-25 NOTE — Discharge Instructions (Addendum)
CIRUGIA LAPAROSCOPICA: INSTRUCCIONES DE POST OPERATORIO. ° °Revise siempre los documentos que le entreguen en el lugar donde se ha hecho la cirugia. ° °SI USTED NECESITA DOCUMENTOS DE INCAPACIDAD (DISABLE) O DE PERMISO FAMILAR (FAMILY LEAVE) NECESITA TRAERLOS A LA OFICINA PARA QUE SEAN PROCESADOS. °NO  SE LOS DE A SU DOCTOR. °1. A su alta del hospital se le dara una receta para controlar el dolor. Tomela como ha sido recetada, si la necesita. Si no la necesita puede tomar, Acetaminofen (Tylenol) o Ibuprofen (Advil) para aliviar dolor moderado. °2. Continue tomando el resto de sus medicinas. °3. Si necesita rellenar la receta, llame a la farmacia. ellos contactan a nuestra oficina pidiendo autorizacion. Este tipo de receta no pueden ser rellenadas despues de las  5pm o durante los fines de semana. °4. Con relacion a la dieta: debe ser ligera los primeros dias despues que llege a la casa. Ejemplo: sopas y galleticas. Tome bastante liquido esos dias. °5. La mayoria de los pacientes padecen de inflamacion y cambio de coloracion de la piel alrededor de las incisiones. esto toma dias en resolver.  pnerse una bolsa de hielo en el area affectada ayuda..  °6. Es comun tambien tener un poco de estrenimiento si esta tomado medicinas para el dolor. incremente la cantidad de liquidos a tomar y puede tomar (Colace) esto previene el problema. Si ya tiene estrenimiento, es decir no ha defecado en 48 horas, puede tomar un laxativo (Milk of Magnesia or Miralax) uselo como el paquete le explica. °7.  A menos que se le diga algo diferente. Remueva el bendaje a las 24-48 horas despues dela cirugia. y puede banarse en la ducha sin ningun problema. usted puede tener steri-strips (pequenas curitas transparentes en la piel puesta encima de la incision)  Estas banditas strips should be left on the skin for 7-10 days.   Si su cirujano puso pegamento encima de la incision usted puede banarse bajo la ducha en 24 horas. Este pegamento empezara a  caerse en las proximas 2-3 semanas. Si le pusieron suturas o presillas (grapos) estos seran quitados en su proxima cita en la oficina. . °a. ACTIVIDADES:  Puede hacer actividad ligera.  Como caminar , subir escaleras y poco a poco irlas incrementando tanto como las tolere. Puede tener relaciones sexuales cuando sea comfortable. No carge objetos pesados o haga esfuerzos que no sean aprovados por su doctor. °b. Puede manejar en cuanto no esta tomando medicamentos fuertes (narcoticos) para el dolor, pueda abrochar confortablemente el cinturon de seguridad, y pueda maniobrar y usar los pedales de su vehiculo con seguridad. °c. PUEDE REGRESAR A TRABAJAR  °8. Debe ver a su doctor para una cita de seguimiento en 2-3 semanas despues de la cirugia.  °9. OTRAS ISNSTRUCCIONES:___________________________________________________________________________________ °CUANDO LLAMAR A SU MEDICO: °1. FIEBRE mayor de  101.0 °2. No produccion de orina. °3. Sangramiento continue de la herida °4. Incremento de dolor, enrojecimientio o drenaje de la herida (incision) °5. Incremento de dolor abdominal. ° °The clinic staff is available to answer your questions during regular business hours.  Please don’t hesitate to call and ask to speak to one of the nurses for clinical concerns.  If you have a medical emergency, go to the nearest emergency room or call 911.  A surgeon from [facility] is always on call at the hospital. °[address], [address], [place], [place]  [address] ? [address] [address], [place], [place]   [address] °[phone] ? [phone] ? FAX [phone] °Web site: [email or web address] ° ° °

## 2019-08-25 NOTE — ED Triage Notes (Signed)
Lower abdominal pain x 3 days.  

## 2019-08-25 NOTE — ED Provider Notes (Signed)
[facility] EMERGENCY DEPARTMENT Provider Note   CSN: [id number] Arrival date & time: 08/25/19  2116     History Chief Complaint  Patient presents with  . Abdominal Pain    Angela Rangel is a 34 y.o. female.  34 y.o female with no PMH presents to the ED with a chief complaint of lower abdominal pain x 3 days.  Patient describes a sharp pain to her left lower quadrant with radiation down to the lower abdomen.  Reports his pain is exacerbated with movement, palpation.  Reports she has had chills at home, but has not checked her temperature.  She also reports she voided and noted gross hematuria.  Also reports her last bowel movement was yesterday without any blood.  Her last menstrual period was May 9, this is currently ending?,  Reports she now has some spotting.  He has not taken any medication for improvement in her symptoms.  Does endorse some nausea, no anorexia.  Denies any past surgical history of her abdomen, urinary symptoms, vaginal discharge, or other complaints.       The history is provided by the patient.       Past Medical History:  Diagnosis Date  . Medical history non-contributory     Patient Active Problem List   Diagnosis Date Noted  . SVD (spontaneous vaginal delivery) 02/08/2017  . Encounter for induction of labor 02/07/2017  . Cholestasis during pregnancy in third trimester 02/07/2017  . Normal labor 03/27/2015    Past Surgical History:  Procedure Laterality Date  . NO PAST SURGERIES       OB History    Gravida  4   Para  4   Term  4   Preterm      AB      Living  4     SAB      TAB      Ectopic      Multiple  0   Live Births  4           No family history on file.  Social History   Tobacco Use  . Smoking status: Never Smoker  . Smokeless tobacco: Never Used  Substance Use Topics  . Alcohol use: No  . Drug use: No    Home Medications Prior to Admission medications   Medication Sig Start Date  End Date Taking? Authorizing Provider  ibuprofen (ADVIL,MOTRIN) 600 MG tablet Take 1 tablet (600 mg total) every 6 (six) hours as needed by mouth for moderate pain or cramping. 02/09/17   Anyanwu, Jethro Bastos, MD  Prenatal Vit-Fe Fumarate-FA (PRENATAL MULTIVITAMIN) TABS tablet Take 1 tablet by mouth at bedtime.    [provider]    Allergies    Patient has no known allergies.  Review of Systems   Review of Systems  Constitutional: Negative for fever.  HENT: Negative for sore throat.   Respiratory: Negative for shortness of breath.   Cardiovascular: Negative for chest pain.  Gastrointestinal: Positive for abdominal pain and nausea. Negative for blood in stool, constipation, diarrhea and vomiting.  Genitourinary: Positive for difficulty urinating and vaginal bleeding. Negative for decreased urine volume, dysuria, flank pain and vaginal discharge.  Neurological: Negative for light-headedness and headaches.  All other systems reviewed and are negative.   Physical Exam Updated Vital Signs BP 111/70   Pulse 87   Temp 100.3 F (37.9 C) (Oral)   Resp 20   Ht 5\' 5"  (1.651 m)   Wt 91.6  kg   LMP 08/12/2019   SpO2 99%   BMI 33.60 kg/m   Physical Exam Vitals and nursing note reviewed.  Constitutional:      General: She is not in acute distress.    Appearance: She is well-developed. She is ill-appearing.  HENT:     Head: Normocephalic and atraumatic.     Mouth/Throat:     Pharynx: No oropharyngeal exudate.  Eyes:     Pupils: Pupils are equal, round, and reactive to light.  Cardiovascular:     Rate and Rhythm: Regular rhythm.     Heart sounds: Normal heart sounds.  Pulmonary:     Effort: Pulmonary effort is normal. No respiratory distress.     Breath sounds: Normal breath sounds.  Abdominal:     General: Bowel sounds are normal. There is no distension.     Palpations: Abdomen is soft.     Tenderness: There is abdominal tenderness in the right lower quadrant, suprapubic  area and left lower quadrant. There is no right CVA tenderness, left CVA tenderness, guarding or rebound. Negative signs include Murphy's sign.  Musculoskeletal:        General: No tenderness or deformity.     Cervical back: Normal range of motion.     Right lower leg: No edema.     Left lower leg: No edema.  Skin:    General: Skin is warm and dry.  Neurological:     Mental Status: She is alert and oriented to person, place, and time.     ED Results / Procedures / Treatments   Labs (all labs ordered are listed, but only abnormal results are displayed) Labs Reviewed  URINALYSIS, ROUTINE W REFLEX MICROSCOPIC - Abnormal; Notable for the following components:      Result Value   pH 8.5 (*)    Hgb urine dipstick TRACE (*)    All other components within normal limits  URINALYSIS, MICROSCOPIC (REFLEX) - Abnormal; Notable for the following components:   Bacteria, UA FEW (*)    All other components within normal limits  CBC WITH DIFFERENTIAL/PLATELET - Abnormal; Notable for the following components:   WBC 11.6 (*)    Neutro Abs 8.4 (*)    All other components within normal limits  COMPREHENSIVE METABOLIC PANEL - Abnormal; Notable for the following components:   Potassium 3.3 (*)    Glucose, Bld 125 (*)    Calcium 8.7 (*)    All other components within normal limits  PREGNANCY, URINE  LIPASE, BLOOD    EKG None  Radiology No results found.  Procedures Procedures (including critical care time)  Medications Ordered in ED Medications  ondansetron (ZOFRAN) injection 4 mg (4 mg Intravenous Given 08/25/19 2230)  sodium chloride 0.9 % bolus 1,000 mL (1,000 mLs Intravenous New Bag/Given 08/25/19 2230)  acetaminophen (TYLENOL) tablet 650 mg (650 mg Oral Given 08/25/19 2231)  fentaNYL (SUBLIMAZE) injection 50 mcg (50 mcg Intravenous Given 08/25/19 2231)  iohexol (OMNIPAQUE) 300 MG/ML solution 100 mL (100 mLs Intravenous Contrast Given 08/25/19 2313)    ED Course  I have reviewed the  triage vital signs and the nursing notes.  Pertinent labs & imaging results that were available during my care of the patient were reviewed by me and considered in my medical decision making (see chart for details).    MDM Rules/Calculators/A&P   Patient with no PMH presents to the ED with a chief complaint of lower abdominal pain x 3 days. Endorses sharp pain to the LLQ with  radiation down to the RLQ and suprapubic region, reports pain is somewhat exacerbated with eating. Endorses nausea, but no vomiting episodes. Has not taken any medication for improvement in symptoms. She arrived in the ED febrile at 100.3, provided with tylenol for symptomatic control.  During evaluation patient is ill appearing, does have ttp along the LLQ and suprapubic region. No upper abdominal pain with palpation. Lungs are clear to auscultation, no wheezing or rales. No pain with palpation of the chest. BL legs without any swelling or pitting edema. Differential diagnosis included but not limited to diverticulitis, appendicitis, versus gastroenteritis.She is currently sexually active with husband at the bedside and reports no concern for sexually transmitted infection.   Interpretation of labs by me, with mild leukocytosis, no signs of anemia. CMP with mild hypokalemia, does not report any diarrhea or emesis. Creatine level is within normal limits. LFT's are unremarkable, no pain with palpation along the RUQ, lower suspicion for gallbladder pathology. No alcohol use reported. Lipase is within normal limits. Pregnancy test is negative. She is currently on her menstrual cycle, but reports no vaginal discharge or irregular odor or pelvic pain. She has seen some hematuria with urination but states she feels like this was likely her period "spotting". UA with few bacteria but with 6-10 squamous therefore contaminated.   CT Abdomen and pelvis is currently pending, care signed out to Dr. Clayborne Dana pending CT.    Portions of this  note were generated with Scientist, clinical (histocompatibility and immunogenetics). Dictation errors may occur despite best attempts at proofreading.  Final Clinical Impression(s) / ED Diagnoses Final diagnoses:  Lower abdominal pain    Rx / DC Orders ED Discharge Orders    None       Claude Manges, Cordelia Poche 08/25/19 2320    Mesner, Barbara Cower, MD 08/26/19 [phone]

## 2019-08-26 ENCOUNTER — Observation Stay (HOSPITAL_COMMUNITY): Payer: Self-pay | Admitting: Certified Registered"

## 2019-08-26 ENCOUNTER — Encounter (HOSPITAL_COMMUNITY)
Admission: EM | Disposition: A | Discharge status: Home / Self Care | Payer: Self-pay | Source: Home / Self Care | Attending: Emergency Medicine

## 2019-08-26 DIAGNOSIS — K3580 Unspecified acute appendicitis: Secondary | ICD-10-CM | POA: Diagnosis present

## 2019-08-26 HISTORY — PX: LAPAROSCOPIC APPENDECTOMY: SHX408

## 2019-08-26 LAB — CBC
HCT: 37.5 % (ref 36.0–46.0)
Hemoglobin: 11.5 g/dL — ABNORMAL LOW (ref 12.0–15.0)
MCH: 28.4 pg (ref 26.0–34.0)
MCHC: 30.7 g/dL (ref 30.0–36.0)
MCV: 92.6 fL (ref 80.0–100.0)
Platelets: 230 10*3/uL (ref 150–400)
RBC: 4.05 MIL/uL (ref 3.87–5.11)
RDW: 14 % (ref 11.5–15.5)
WBC: 10.5 10*3/uL (ref 4.0–10.5)
nRBC: 0 % (ref 0.0–0.2)

## 2019-08-26 LAB — HIV ANTIBODY (ROUTINE TESTING W REFLEX): HIV Screen 4th Generation wRfx: NONREACTIVE

## 2019-08-26 LAB — CREATININE, SERUM
Creatinine, Ser: 0.62 mg/dL (ref 0.44–1.00)
GFR calc Af Amer: >60 mL/min (ref >60)
GFR calc non Af Amer: >60 mL/min (ref >60)

## 2019-08-26 LAB — SURGICAL PCR SCREEN
MRSA, PCR: NEGATIVE
Staphylococcus aureus: NEGATIVE

## 2019-08-26 LAB — SARS CORONAVIRUS 2 BY RT PCR (HOSPITAL ORDER, PERFORMED IN ~~LOC~~ HOSPITAL LAB): SARS Coronavirus 2: NEGATIVE

## 2019-08-26 SURGERY — APPENDECTOMY, LAPAROSCOPIC
Anesthesia: General

## 2019-08-26 MED ORDER — LACTATED RINGERS IV BOLUS
1000.0000 mL | Freq: Once | INTRAVENOUS | Status: AC
Start: 2019-08-26 — End: 2019-08-26
  Administered 2019-08-26: 1000 mL via INTRAVENOUS

## 2019-08-26 MED ORDER — HYDROMORPHONE HCL 1 MG/ML IJ SOLN
0.5000 mg | INTRAMUSCULAR | Status: DC | PRN
  Administered 2019-08-26: 0.5 mg via INTRAVENOUS
  Filled 2019-08-26: qty 0.5

## 2019-08-26 MED ORDER — PROMETHAZINE HCL 25 MG/ML IJ SOLN
INTRAMUSCULAR | Status: AC
  Filled 2019-08-26: qty 1

## 2019-08-26 MED ORDER — DIPHENHYDRAMINE HCL 50 MG/ML IJ SOLN: 12.5000 mg | Freq: Four times a day (QID) | INTRAMUSCULAR | Status: DC | PRN

## 2019-08-26 MED ORDER — ONDANSETRON 4 MG PO TBDP: 4.0000 mg | ORAL_TABLET | Freq: Four times a day (QID) | ORAL | Status: DC | PRN

## 2019-08-26 MED ORDER — ROCURONIUM BROMIDE 10 MG/ML (PF) SYRINGE
PREFILLED_SYRINGE | INTRAVENOUS | Status: DC | PRN
  Administered 2019-08-26: 50 mg via INTRAVENOUS

## 2019-08-26 MED ORDER — DEXAMETHASONE SODIUM PHOSPHATE 10 MG/ML IJ SOLN
INTRAMUSCULAR | Status: DC | PRN
  Administered 2019-08-26: 4 mg via INTRAVENOUS

## 2019-08-26 MED ORDER — SIMETHICONE 80 MG PO CHEW: 40.0000 mg | CHEWABLE_TABLET | Freq: Four times a day (QID) | ORAL | Status: DC | PRN

## 2019-08-26 MED ORDER — OXYCODONE HCL 5 MG PO TABS
5.0000 mg | ORAL_TABLET | ORAL | Status: DC | PRN
  Administered 2019-08-26 (×2): 10 mg via ORAL
  Filled 2019-08-26 (×2): qty 2

## 2019-08-26 MED ORDER — ACETAMINOPHEN 500 MG PO TABS
1000.0000 mg | ORAL_TABLET | Freq: Four times a day (QID) | ORAL | Status: DC
  Administered 2019-08-26: 1000 mg via ORAL
  Filled 2019-08-26: qty 2

## 2019-08-26 MED ORDER — SODIUM CHLORIDE 0.9 % IV SOLN
2.0000 g | INTRAVENOUS | Status: DC
  Administered 2019-08-26: 2 g via INTRAVENOUS
  Filled 2019-08-26: qty 2

## 2019-08-26 MED ORDER — PHENYLEPHRINE 40 MCG/ML (10ML) SYRINGE FOR IV PUSH (FOR BLOOD PRESSURE SUPPORT)
PREFILLED_SYRINGE | INTRAVENOUS | Status: DC | PRN
  Administered 2019-08-26 (×4): 60 ug via INTRAVENOUS
  Administered 2019-08-26: 40 ug via INTRAVENOUS
  Administered 2019-08-26: 60 ug via INTRAVENOUS

## 2019-08-26 MED ORDER — PROPOFOL 10 MG/ML IV BOLUS
INTRAVENOUS | Status: AC
  Filled 2019-08-26: qty 20

## 2019-08-26 MED ORDER — PROMETHAZINE HCL 25 MG/ML IJ SOLN
6.2500 mg | Freq: Four times a day (QID) | INTRAMUSCULAR | Status: DC | PRN
  Administered 2019-08-26 (×2): 6.25 mg via INTRAVENOUS
  Filled 2019-08-26 (×2): qty 1

## 2019-08-26 MED ORDER — LIDOCAINE 2% (20 MG/ML) 5 ML SYRINGE
INTRAMUSCULAR | Status: DC | PRN
  Administered 2019-08-26: 40 mg via INTRAVENOUS

## 2019-08-26 MED ORDER — HYDROMORPHONE HCL 1 MG/ML IJ SOLN
INTRAMUSCULAR | Status: AC
  Administered 2019-08-26: 0.5 mg via INTRAVENOUS
  Filled 2019-08-26: qty 1

## 2019-08-26 MED ORDER — CHLORHEXIDINE GLUCONATE 0.12 % MT SOLN
15.0000 mL | Freq: Once | OROMUCOSAL | Status: AC
  Administered 2019-08-26: 15 mL via OROMUCOSAL

## 2019-08-26 MED ORDER — ONDANSETRON HCL 4 MG/2ML IJ SOLN
4.0000 mg | Freq: Four times a day (QID) | INTRAMUSCULAR | Status: DC | PRN
  Administered 2019-08-26: 4 mg via INTRAVENOUS
  Filled 2019-08-26: qty 2

## 2019-08-26 MED ORDER — PIPERACILLIN-TAZOBACTAM 3.375 G IVPB 30 MIN
3.3750 g | Freq: Once | INTRAVENOUS | Status: AC
  Administered 2019-08-26: 3.375 g via INTRAVENOUS
  Filled 2019-08-26 (×2): qty 50

## 2019-08-26 MED ORDER — ROCURONIUM BROMIDE 10 MG/ML (PF) SYRINGE
PREFILLED_SYRINGE | INTRAVENOUS | Status: AC
  Filled 2019-08-26: qty 10

## 2019-08-26 MED ORDER — ONDANSETRON HCL 4 MG/2ML IJ SOLN
4.0000 mg | Freq: Four times a day (QID) | INTRAMUSCULAR | Status: DC | PRN
  Administered 2019-08-26 (×2): 4 mg via INTRAVENOUS
  Filled 2019-08-26 (×2): qty 2

## 2019-08-26 MED ORDER — IBUPROFEN 400 MG PO TABS: 600.0000 mg | ORAL_TABLET | Freq: Four times a day (QID) | ORAL | Status: DC | PRN

## 2019-08-26 MED ORDER — FENTANYL CITRATE (PF) 100 MCG/2ML IJ SOLN
INTRAMUSCULAR | Status: AC
  Filled 2019-08-26: qty 2

## 2019-08-26 MED ORDER — METOPROLOL TARTRATE 5 MG/5ML IV SOLN: 5.0000 mg | Freq: Four times a day (QID) | INTRAVENOUS | Status: DC | PRN

## 2019-08-26 MED ORDER — CELECOXIB 200 MG PO CAPS
200.0000 mg | ORAL_CAPSULE | Freq: Two times a day (BID) | ORAL | Status: DC
  Administered 2019-08-26 – 2019-08-27 (×2): 200 mg via ORAL
  Filled 2019-08-26 (×2): qty 1

## 2019-08-26 MED ORDER — LIDOCAINE 2% (20 MG/ML) 5 ML SYRINGE
INTRAMUSCULAR | Status: AC
  Filled 2019-08-26: qty 5

## 2019-08-26 MED ORDER — DIPHENHYDRAMINE HCL 12.5 MG/5ML PO ELIX: 12.5000 mg | ORAL_SOLUTION | Freq: Four times a day (QID) | ORAL | Status: DC | PRN

## 2019-08-26 MED ORDER — HYDROMORPHONE HCL 1 MG/ML IJ SOLN
0.5000 mg | INTRAMUSCULAR | Status: DC | PRN
  Administered 2019-08-26: 1 mg via INTRAVENOUS
  Filled 2019-08-26: qty 1

## 2019-08-26 MED ORDER — BUPIVACAINE LIPOSOME 1.3 % IJ SUSP
20.0000 mL | Freq: Once | INTRAMUSCULAR | Status: DC
  Filled 2019-08-26: qty 20

## 2019-08-26 MED ORDER — ENOXAPARIN SODIUM 40 MG/0.4ML ~~LOC~~ SOLN
40.0000 mg | SUBCUTANEOUS | Status: DC
  Administered 2019-08-27: 40 mg via SUBCUTANEOUS
  Filled 2019-08-26: qty 0.4

## 2019-08-26 MED ORDER — SUGAMMADEX SODIUM 200 MG/2ML IV SOLN
INTRAVENOUS | Status: DC | PRN
  Administered 2019-08-26: 200 mg via INTRAVENOUS

## 2019-08-26 MED ORDER — MIDAZOLAM HCL 2 MG/2ML IJ SOLN
INTRAMUSCULAR | Status: AC
  Filled 2019-08-26: qty 2

## 2019-08-26 MED ORDER — PROMETHAZINE HCL 25 MG/ML IJ SOLN
6.2500 mg | INTRAMUSCULAR | Status: DC | PRN
  Administered 2019-08-26: 6.25 mg via INTRAVENOUS

## 2019-08-26 MED ORDER — MEPERIDINE HCL 50 MG/ML IJ SOLN: 6.2500 mg | INTRAMUSCULAR | Status: DC | PRN

## 2019-08-26 MED ORDER — PROPOFOL 10 MG/ML IV BOLUS
INTRAVENOUS | Status: DC | PRN
  Administered 2019-08-26: 150 mg via INTRAVENOUS

## 2019-08-26 MED ORDER — LACTATED RINGERS IR SOLN
Status: DC | PRN
  Administered 2019-08-26: 1000 mL

## 2019-08-26 MED ORDER — POTASSIUM CHLORIDE 10 MEQ/100ML IV SOLN
10.0000 meq | INTRAVENOUS | Status: DC
  Administered 2019-08-26: 10 meq via INTRAVENOUS
  Filled 2019-08-26: qty 100

## 2019-08-26 MED ORDER — POTASSIUM CHLORIDE IN NACL 40-0.9 MEQ/L-% IV SOLN
INTRAVENOUS | Status: DC
  Administered 2019-08-26 – 2019-08-27 (×4): 125 mL/h via INTRAVENOUS
  Filled 2019-08-26 (×6): qty 1000

## 2019-08-26 MED ORDER — ONDANSETRON HCL 4 MG/2ML IJ SOLN
INTRAMUSCULAR | Status: AC
  Filled 2019-08-26: qty 2

## 2019-08-26 MED ORDER — IBUPROFEN 200 MG PO TABS: 600.0000 mg | ORAL_TABLET | Freq: Four times a day (QID) | ORAL | Status: DC | PRN

## 2019-08-26 MED ORDER — LACTATED RINGERS IV SOLN: INTRAVENOUS | Status: DC

## 2019-08-26 MED ORDER — HEPARIN SODIUM (PORCINE) 5000 UNIT/ML IJ SOLN: 5000.0000 [IU] | Freq: Three times a day (TID) | INTRAMUSCULAR | Status: DC

## 2019-08-26 MED ORDER — HYDROMORPHONE HCL 1 MG/ML IJ SOLN
1.0000 mg | Freq: Once | INTRAMUSCULAR | Status: AC
  Administered 2019-08-26: 1 mg via INTRAVENOUS
  Filled 2019-08-26: qty 1

## 2019-08-26 MED ORDER — ONDANSETRON HCL 4 MG/2ML IJ SOLN
INTRAMUSCULAR | Status: DC | PRN
  Administered 2019-08-26: 4 mg via INTRAVENOUS

## 2019-08-26 MED ORDER — DEXAMETHASONE SODIUM PHOSPHATE 10 MG/ML IJ SOLN
INTRAMUSCULAR | Status: AC
  Filled 2019-08-26: qty 1

## 2019-08-26 MED ORDER — KETOROLAC TROMETHAMINE 30 MG/ML IJ SOLN: 30.0000 mg | Freq: Once | INTRAMUSCULAR | Status: DC | PRN

## 2019-08-26 MED ORDER — OXYCODONE HCL 5 MG PO TABS
5.0000 mg | ORAL_TABLET | ORAL | Status: DC | PRN
  Administered 2019-08-27 (×2): 10 mg via ORAL
  Filled 2019-08-26 (×2): qty 2

## 2019-08-26 MED ORDER — ACETAMINOPHEN 500 MG PO TABS
1000.0000 mg | ORAL_TABLET | Freq: Four times a day (QID) | ORAL | Status: DC
  Administered 2019-08-27 (×2): 1000 mg via ORAL
  Filled 2019-08-26 (×3): qty 2

## 2019-08-26 MED ORDER — HYDROMORPHONE HCL 1 MG/ML IJ SOLN
0.2500 mg | INTRAMUSCULAR | Status: DC | PRN
  Administered 2019-08-26: 0.5 mg via INTRAVENOUS

## 2019-08-26 MED ORDER — FENTANYL CITRATE (PF) 250 MCG/5ML IJ SOLN
INTRAMUSCULAR | Status: DC | PRN
  Administered 2019-08-26 (×2): 50 ug via INTRAVENOUS

## 2019-08-26 MED ORDER — MIDAZOLAM HCL 2 MG/2ML IJ SOLN
INTRAMUSCULAR | Status: DC | PRN
  Administered 2019-08-26: 1 mg via INTRAVENOUS

## 2019-08-26 MED ORDER — GABAPENTIN 300 MG PO CAPS
300.0000 mg | ORAL_CAPSULE | Freq: Two times a day (BID) | ORAL | Status: DC
  Administered 2019-08-26 – 2019-08-27 (×2): 300 mg via ORAL
  Filled 2019-08-26 (×2): qty 1

## 2019-08-26 MED ORDER — MUPIROCIN 2 % EX OINT: 1.0000 "application " | TOPICAL_OINTMENT | Freq: Two times a day (BID) | CUTANEOUS | Status: DC

## 2019-08-26 MED ORDER — METRONIDAZOLE IN NACL 5-0.79 MG/ML-% IV SOLN
500.0000 mg | Freq: Three times a day (TID) | INTRAVENOUS | Status: DC
  Administered 2019-08-26: 500 mg via INTRAVENOUS
  Filled 2019-08-26: qty 100

## 2019-08-26 MED ORDER — BUPIVACAINE LIPOSOME 1.3 % IJ SUSP
INTRAMUSCULAR | Status: DC | PRN
  Administered 2019-08-26: 20 mL

## 2019-08-26 SURGICAL SUPPLY — 44 items
APPLIER CLIP ROT 10 11.4 M/L (STAPLE)
CABLE HIGH FREQUENCY MONO STRZ (ELECTRODE) IMPLANT
CHLORAPREP W/TINT 26 (MISCELLANEOUS) ×3 IMPLANT
CLIP APPLIE ROT 10 11.4 M/L (STAPLE) IMPLANT
COVER SURGICAL LIGHT HANDLE (MISCELLANEOUS) ×3 IMPLANT
COVER WAND RF STERILE (DRAPES) IMPLANT
CUTTER FLEX LINEAR 45M (STAPLE) ×3 IMPLANT
DECANTER SPIKE VIAL GLASS SM (MISCELLANEOUS) IMPLANT
DERMABOND ADVANCED (GAUZE/BANDAGES/DRESSINGS) ×2
DERMABOND ADVANCED .7 DNX12 (GAUZE/BANDAGES/DRESSINGS) ×1 IMPLANT
DRAPE LAPAROSCOPIC ABDOMINAL (DRAPES) IMPLANT
ELECT REM PT RETURN 15FT ADLT (MISCELLANEOUS) ×3 IMPLANT
ENDOLOOP SUT PDS II  0 18 (SUTURE)
ENDOLOOP SUT PDS II 0 18 (SUTURE) IMPLANT
GLOVE BIOGEL M 8.0 STRL (GLOVE) ×3 IMPLANT
GOWN STRL REUS W/TWL XL LVL3 (GOWN DISPOSABLE) ×3 IMPLANT
KIT BASIN (CUSTOM PROCEDURE TRAY) ×3 IMPLANT
KIT TURNOVER KIT A (KITS) IMPLANT
L-HOOK LAP DISP 36CM (ELECTROSURGICAL) ×3
LHOOK LAP DISP 36CM (ELECTROSURGICAL) IMPLANT
PAD POSITIONING PINK XL (MISCELLANEOUS) ×3 IMPLANT
PENCIL SMOKE EVACUATOR (MISCELLANEOUS) IMPLANT
POUCH RETRIEVAL ECOSAC 10 (ENDOMECHANICALS) IMPLANT
POUCH RETRIEVAL ECOSAC 10MM (ENDOMECHANICALS) ×2
POUCH SPECIMEN RETRIEVAL 10MM (ENDOMECHANICALS) ×3 IMPLANT
RELOAD 45 VASCULAR/THIN (ENDOMECHANICALS) ×3 IMPLANT
RELOAD STAPLE 45 2.5 WHT GRN (ENDOMECHANICALS) IMPLANT
RELOAD STAPLE 45 3.5 BLU ETS (ENDOMECHANICALS) IMPLANT
RELOAD STAPLE TA45 3.5 REG BLU (ENDOMECHANICALS) IMPLANT
SCISSORS LAP 5X45 EPIX DISP (ENDOMECHANICALS) ×3 IMPLANT
SET IRRIG TUBING LAPAROSCOPIC (IRRIGATION / IRRIGATOR) ×3 IMPLANT
SET TUBE SMOKE EVAC HIGH FLOW (TUBING) ×3 IMPLANT
SHEARS HARMONIC ACE PLUS 45CM (MISCELLANEOUS) ×3 IMPLANT
SLEEVE XCEL OPT CAN 5 100 (ENDOMECHANICALS) ×3 IMPLANT
STAPLER VISISTAT 35W (STAPLE) IMPLANT
SUT MNCRL AB 4-0 PS2 18 (SUTURE) ×3 IMPLANT
SUT VICRYL 0 UR6 27IN ABS (SUTURE) ×4 IMPLANT
TOWEL OR 17X26 10 PK STRL BLUE (TOWEL DISPOSABLE) ×3 IMPLANT
TRAY FOLEY MTR SLVR 14FR STAT (SET/KITS/TRAYS/PACK) IMPLANT
TRAY FOLEY MTR SLVR 16FR STAT (SET/KITS/TRAYS/PACK) IMPLANT
TRAY LAPAROSCOPIC (CUSTOM PROCEDURE TRAY) ×3 IMPLANT
TROCAR BLADELESS OPT 5 100 (ENDOMECHANICALS) ×3 IMPLANT
TROCAR XCEL BLUNT TIP 100MML (ENDOMECHANICALS) ×3 IMPLANT
TROCAR XCEL NON-BLD 11X100MML (ENDOMECHANICALS) IMPLANT

## 2019-08-26 NOTE — ED Provider Notes (Signed)
Medical screening examination/treatment/procedure(s) were conducted as a shared visit with non-physician practitioner(s) and myself.  I personally evaluated the patient during the encounter.  Here with few days of abdominal pain.  Told the PA that it was midline and on the left but on multiple examinations I found it to be midline and right.  She had some guarding to the area no specific rebound or other evidence of peritonitis.  She does have elevated white blood cell count and her CT scan shows symptoms consistent with tip appendicitis.  Oral temperature of 100.3.  At this time I think that appendicitis is likely cause of her symptoms secondary to her history and the CT scan.  Discussed with Dr. Cliffton Asters at [facility] who requests ED to ED transfer in order to be evaluated for likely admission and surgery.  I discussed with Dr. Wilkie Aye who accepts to the ED.  antibiotics started here after blood cultures drawn.        Anastyn Ayars, Barbara Cower, MD 08/26/19 [phone]

## 2019-08-26 NOTE — Op Note (Signed)
Angela Rangel  Jul 10, 1985   08/26/2019    PCP:  Patient, No Pcp Per   Surgeon: Wenda Low, MD, FACS  Asst:  none  Anes:  general  Preop Dx: Acute appendicitis Postop Dx: same  Procedure: Laparoscopic appendectomy Location Surgery: [facility] 4 Complications: none  EBL:   minimal cc  Drains: none  Description of Procedure:  The patient was taken to OR 4 .  After anesthesia was administered and the patient was prepped  with chloroprep  and a timeout was performed.  Access achieved with a longitudinal incision into the umbilicus place a Hasson there and two 5 mm ports in the right upper quadrant and left lower quadrant.  The tip of the appendix was stuck in the pelvis and was surrounded by purulence.  It was mobilized and the base isolated with the help of the Harmonic scalpel.  A 4.5 cm vascular load on the EThicon stapler was used to transect the appendix and the base.  No bleeding noted.  The appendix was placed in a bag and brought out through the umbilical port.    The umbilical port was closed with two simple fascial sutures of 0 vicryl.  Port sites were inspected and no bleeding were noted.  The abdomen was deflated and the wounds infiltrated with Exparel and closed with 4-0 Monocryl.  Dermabond was applied.    The patient tolerated the procedure well and was taken to the PACU in stable condition.     Matt B. Daphine Deutscher, MD, [facility], [place] [phone]

## 2019-08-26 NOTE — Anesthesia Procedure Notes (Signed)
Procedure Name: Intubation Date/Time: 08/26/2019 11:54 AM Performed by: Eben Burow, CRNA Pre-anesthesia Checklist: Patient identified, Emergency Drugs available, Suction available, Patient being monitored and Timeout performed Patient Re-evaluated:Patient Re-evaluated prior to induction Oxygen Delivery Method: Circle system utilized Preoxygenation: Pre-oxygenation with 100% oxygen Induction Type: IV induction Ventilation: Mask ventilation without difficulty Laryngoscope Size: Mac and 4 Grade View: Grade I Tube type: Oral Tube size: 7.0 mm Number of attempts: 1 Airway Equipment and Method: Stylet Placement Confirmation: ETT inserted through vocal cords under direct vision,  positive ETCO2 and breath sounds checked- equal and bilateral Secured at: 22 cm Tube secured with: Tape Dental Injury: Teeth and Oropharynx as per pre-operative assessment

## 2019-08-26 NOTE — Anesthesia Preprocedure Evaluation (Signed)
Anesthesia Evaluation  Patient identified by MRN, date of birth, ID band Patient awake    Reviewed: Allergy & Precautions, NPO status , Patient's Chart, lab work & pertinent test results  Airway Mallampati: II       Dental no notable dental hx. (+) Teeth Intact   Pulmonary neg pulmonary ROS,    Pulmonary exam normal breath sounds clear to auscultation       Cardiovascular negative cardio ROS Normal cardiovascular exam Rhythm:Regular Rate:Normal     Neuro/Psych negative neurological ROS  negative psych ROS   GI/Hepatic negative GI ROS, Neg liver ROS,   Endo/Other  negative endocrine ROS  Renal/GU negative Renal ROS  negative genitourinary   Musculoskeletal negative musculoskeletal ROS (+)   Abdominal (+) + obese,   Peds  Hematology negative hematology ROS (+)   Anesthesia Other Findings   Reproductive/Obstetrics                             Anesthesia Physical Anesthesia Plan  ASA: II  Anesthesia Plan: General   Post-op Pain Management:    Induction: Intravenous  PONV Risk Score and Plan: 4 or greater and Ondansetron, Dexamethasone and Treatment may vary due to age or medical condition  Airway Management Planned: Oral ETT  Additional Equipment: None  Intra-op Plan:   Post-operative Plan: Extubation in OR  Informed Consent: I have reviewed the patients History and Physical, chart, labs and discussed the procedure including the risks, benefits and alternatives for the proposed anesthesia with the patient or authorized representative who has indicated his/her understanding and acceptance.     Dental advisory given  Plan Discussed with: CRNA  Anesthesia Plan Comments: (Consent obtained via language line.)        Anesthesia Quick Evaluation

## 2019-08-26 NOTE — Progress Notes (Addendum)
Day of Surgery    CC: Abdominal pain  Subjective: Still complaining of pain and nausea.  Pain is in the right lower quadrant.  She says she does not have any other medical issues.  She is not tolerating the IV potassium well.  She reports hematuria for 2 weeks and sensor cycle should not begin for another 10 to 12 days.  Objective: Vital signs in last 24 hours: Temp:  [98.1 F (36.7 C)-100.3 F (37.9 C)] 98.1 F (36.7 C) (06/01 0629) Pulse Rate:  [65-87] 65 (06/01 0629) Resp:  [13-21] 19 (06/01 0629) BP: (99-114)/(66-78) 108/72 (06/01 0629) SpO2:  [96 %-100 %] 98 % (06/01 0629) Weight:  [91.6 kg-92.9 kg] 92.9 kg (06/01 0339) Last BM Date: 08/25/19 N.p.o. 2203 IV 400 urine No other input or uptake recorded. Afebrile vital signs are stable WBC 11.6, hemoglobin 12.7, hematocrit 37.9, platelets 242,000. K+ 3.3, glucose 125, calcium 8.7, remainder CMP is normal. Intake/Output from previous day: 05/31 0701 - 06/01 0700 In: 2203.1 [I.V.:131.8; IV Piggyback:2071.3] Out: 400 [Urine:400] Intake/Output this shift: No intake/output data recorded.  General appearance: alert, cooperative and no distress Resp: clear to auscultation bilaterally GI: Soft, tender in the right lower quadrant.  Positive bowel sounds.  Lab Results:  Recent Labs    08/25/19 2233  WBC 11.6*  HGB 12.7  HCT 37.9  PLT 242    BMET Recent Labs    08/25/19 2233  NA 138  K 3.3*  CL 106  CO2 22  GLUCOSE 125*  BUN 11  CREATININE 0.84  CALCIUM 8.7*   PT/INR No results for input(s): LABPROT, INR in the last 72 hours.  Recent Labs  Lab 08/25/19 2233  AST 17  ALT 21  ALKPHOS 55  BILITOT 0.4  PROT 7.1  ALBUMIN 3.5     Lipase     Component Value Date/Time   LIPASE 33 08/25/2019 2233     Medications: . acetaminophen  1,000 mg Oral Q6H  . heparin  5,000 Units Subcutaneous Q8H   . cefTRIAXone (ROCEPHIN)  IV 2 g (08/26/19 0657)   And  . metronidazole 500 mg (08/26/19 0532)  . lactated  ringers 75 mL/hr at 08/26/19 0351  . potassium chloride 10 mEq (08/26/19 0749)    Assessment/Plan  Right lower quadrant pain/acute appendicitis  FEN: IV fluids/n.p.o. ID: Rocephin/Flagyl 5/31 >> DVT: Heparin Follow-up: [facility]  Plan: Laparoscopic appendectomy later this AM.  I have changed out her IV fluids to put 40 potassium and normal saline, and we will discontinue the potassium runs after the new IV fluid is closed.  I spent approximately 30 minutes with the interpreter describing appendicitis and our plans for treatment.  Patient says she had no questions at the end of our discussion.  UA is unremarkable 0-5 RBC and WBC/HPF.  Urine and blood culture pending.  LOS: 0 days    Kassi Esteve 08/26/2019 Please see Amion

## 2019-08-26 NOTE — ED Notes (Signed)
EDP at bedside using interpreter ipad.

## 2019-08-26 NOTE — ED Notes (Signed)
[facility] notified Angela Rangel) - patient ready for transport to [facility] ED

## 2019-08-26 NOTE — Transfer of Care (Signed)
Immediate Anesthesia Transfer of Care Note  [name]  Procedure(s) Performed: APPENDECTOMY LAPAROSCOPIC (N/A )  Patient Location: PACU  Anesthesia Type:General  Level of Consciousness: drowsy and patient cooperative  Airway & Oxygen Therapy: Patient Spontanous Breathing and Patient connected to face mask oxygen  Post-op Assessment: Report given to RN and Post -op Vital signs reviewed and stable  Post vital signs: Reviewed and stable  Last Vitals:  Vitals Value Taken Time  BP    Temp    Pulse    Resp    SpO2      Last Pain:  Vitals:   08/26/19 1121  TempSrc: Oral  PainSc:       Patients Stated Pain Goal: 3 (08/26/19 0745)  Complications: No apparent anesthesia complications

## 2019-08-26 NOTE — ED Notes (Signed)
Unable to scan lactated ringers bolus, states it is "locked" at another work station. Bolus was administered at this time.

## 2019-08-26 NOTE — H&P (Signed)
CC: RLQ, hypogastric pain  Requesting provider: Dr. Wilkie Aye  HPI: Angela Rangel is an 34 y.o. female with no known medical hx presented to [facility] last night with 4d hx of gradually worsening RLQ pain. Associated with fever, chills, nausea, vomiting and loose stool. Denies ever having had this problem before. Reports pain as being sharp and nonradiating. Nothing makes it better/worse. She denies weight changes or blood in her stool.  Past Medical History:  Diagnosis Date  . Medical history non-contributory     Past Surgical History:  Procedure Laterality Date  . NO PAST SURGERIES      No family history on file.  Social:  reports that she has never smoked. She has never used smokeless tobacco. She reports that she does not drink alcohol or use drugs.  Allergies: No Known Allergies  Medications: I have reviewed the patient's current medications.  Results for orders placed or performed during the hospital encounter of 08/25/19 (from the past 48 hour(s))  Urinalysis, Routine w reflex microscopic     Status: Abnormal   Collection Time: 08/25/19  9:25 PM  Result Value Ref Range   Color, Urine YELLOW YELLOW   APPearance CLEAR CLEAR   Specific Gravity, Urine 1.020 1.005 - 1.030   pH 8.5 (H) 5.0 - 8.0   Glucose, UA NEGATIVE NEGATIVE mg/dL   Hgb urine dipstick TRACE (A) NEGATIVE   Bilirubin Urine NEGATIVE NEGATIVE   Ketones, ur NEGATIVE NEGATIVE mg/dL   Protein, ur NEGATIVE NEGATIVE mg/dL   Nitrite NEGATIVE NEGATIVE   Leukocytes,Ua NEGATIVE NEGATIVE    Comment: Performed at [facility], [address] [place], [place] [address]  Pregnancy, urine     Status: None   Collection Time: 08/25/19  9:25 PM  Result Value Ref Range   Preg Test, Ur NEGATIVE NEGATIVE    Comment:        THE SENSITIVITY OF THIS METHODOLOGY IS >20 mIU/mL. Performed at [facility], [address] [place] [address] [place], [place] [address]   Urinalysis, Microscopic (reflex)     Status:  Abnormal   Collection Time: 08/25/19  9:25 PM  Result Value Ref Range   RBC / HPF 0-5 0 - 5 RBC/hpf   WBC, UA 0-5 0 - 5 WBC/hpf   Bacteria, UA FEW (A) NONE SEEN   Squamous Epithelial / LPF 6-10 0 - 5   Amorphous Crystal PRESENT     Comment: Performed at [facility], [address] [place], [place] [address]  CBC with Differential     Status: Abnormal   Collection Time: 08/25/19 10:33 PM  Result Value Ref Range   WBC 11.6 (H) 4.0 - 10.5 K/uL   RBC 4.38 3.87 - 5.11 MIL/uL   Hemoglobin 12.7 12.0 - 15.0 g/dL   HCT 93.2 35.5 - 73.2 %   MCV 86.5 80.0 - 100.0 fL   MCH 29.0 26.0 - 34.0 pg   MCHC 33.5 30.0 - 36.0 g/dL   RDW 20.2 54.2 - 70.6 %   Platelets 242 150 - 400 K/uL   nRBC 0.0 0.0 - 0.2 %   Neutrophils Relative % 72 %   Neutro Abs 8.4 (H) 1.7 - 7.7 K/uL   Lymphocytes Relative 19 %   Lymphs Abs 2.2 0.7 - 4.0 K/uL   Monocytes Relative 7 %   Monocytes Absolute 0.8 0.1 - 1.0 K/uL   Eosinophils Relative 2 %   Eosinophils Absolute 0.2 0.0 - 0.5 K/uL  Basophils Relative 0 %   Basophils Absolute 0.0 0.0 - 0.1 K/uL   Immature Granulocytes 0 %   Abs Immature Granulocytes 0.03 0.00 - 0.07 K/uL    Comment: Performed at [facility], [address] [place], [place] [address]  Comprehensive metabolic panel     Status: Abnormal   Collection Time: 08/25/19 10:33 PM  Result Value Ref Range   Sodium 138 135 - 145 mmol/L   Potassium 3.3 (L) 3.5 - 5.1 mmol/L   Chloride 106 98 - 111 mmol/L   CO2 22 22 - 32 mmol/L   Glucose, Bld 125 (H) 70 - 99 mg/dL    Comment: Glucose reference range applies only to samples taken after fasting for at least 8 hours.   BUN 11 6 - 20 mg/dL   Creatinine, Ser 8.84 0.44 - 1.00 mg/dL   Calcium 8.7 (L) 8.9 - 10.3 mg/dL   Total Protein 7.1 6.5 - 8.1 g/dL   Albumin 3.5 3.5 - 5.0 g/dL   AST 17 15 - 41 U/L   ALT 21 0 - 44 U/L   Alkaline Phosphatase 55 38 - 126 U/L   Total Bilirubin 0.4 0.3 - 1.2 mg/dL   GFR calc non Af Amer >60 >60 mL/min    GFR calc Af Amer >60 >60 mL/min   Anion gap 10 5 - 15    Comment: Performed at [facility], [address] [place], [place] [address]  Lipase, blood     Status: None   Collection Time: 08/25/19 10:33 PM  Result Value Ref Range   Lipase 33 11 - 51 U/L    Comment: Performed at [facility], [address] [place], [place] [address]  SARS Coronavirus 2 by RT PCR (hospital order, performed in [facility] hospital lab) Nasopharyngeal Nasopharyngeal Swab     Status: None   Collection Time: 08/26/19  1:40 AM   Specimen: Nasopharyngeal Swab  Result Value Ref Range   SARS Coronavirus 2 NEGATIVE NEGATIVE    Comment: (NOTE) SARS-CoV-2 target nucleic acids are NOT DETECTED. The SARS-CoV-2 RNA is generally detectable in upper and lower respiratory specimens during the acute phase of infection. The lowest concentration of SARS-CoV-2 viral copies this assay can detect is 250 copies / mL. A negative result does not preclude SARS-CoV-2 infection and should not be used as the sole basis for treatment or other patient management decisions.  A negative result may occur with improper specimen collection / handling, submission of specimen other than nasopharyngeal swab, presence of viral mutation(s) within the areas targeted by this assay, and inadequate number of viral copies (<250 copies / mL). A negative result must be combined with clinical observations, patient history, and epidemiological information. Fact Sheet for Patients:   BoilerBrush.com.cy Fact Sheet for Healthcare Providers: https://pope.com/ This test is not yet approved or cleared  by the [place] FDA and has been authorized for detection and/or diagnosis of SARS-CoV-2 by FDA under an Emergency Use Authorization (EUA).  This EUA will remain in effect (meaning this test can be used) for the duration of the COVID-19 declaration under Section 564(b)(1) of the  Act, 21 U.S.C. section 360bbb-3(b)(1), unless the authorization is terminated or revoked sooner. Performed at [facility], [address] [place], [place] [address]   Surgical PCR screen     Status: None   Collection Time: 08/26/19  3:53 AM   Specimen: Nasal Mucosa; Nasal Swab  Result  Value Ref Range   MRSA, PCR NEGATIVE NEGATIVE   Staphylococcus aureus NEGATIVE NEGATIVE    Comment: (NOTE) The Xpert SA Assay (FDA approved for NASAL specimens in patients 34 years of age and older), is one component of a comprehensive surveillance program. It is not intended to diagnose infection nor to guide or monitor treatment. Performed at [facility], [address] [place], [place] [id number]     CT ABDOMEN PELVIS W CONTRAST  Result Date: 08/25/2019 CLINICAL DATA:  34 year old female with lower abdominal pain. Concern for acute diverticulitis. EXAM: CT ABDOMEN AND PELVIS WITH CONTRAST TECHNIQUE: Multidetector CT imaging of the abdomen and pelvis was performed using the standard protocol following bolus administration of intravenous contrast. CONTRAST:  100mL OMNIPAQUE IOHEXOL 300 MG/ML  SOLN COMPARISON:  None. FINDINGS: Lower chest: The visualized lung bases are clear. No intra-abdominal free air or free fluid. Hepatobiliary: Fatty infiltration of the liver. No intrahepatic biliary ductal dilatation. The gallbladder is unremarkable. Pancreas: Unremarkable. No pancreatic ductal dilatation or surrounding inflammatory changes. Spleen: Normal in size without focal abnormality. Adrenals/Urinary Tract: The adrenal glands are unremarkable. The kidneys, visualized ureters, and urinary bladder appear unremarkable. Stomach/Bowel: There is a 10 x 13 mm focus of inflammation within the posterior pelvis to the right of the midline (70/2 and 68/5). This inflammatory focus is located at the tip of the appendix. This may represent a tip appendicitis with possible rupture or phlegmonous  changes or an epiploic appendagitis. Clinical correlation is recommended. No drainable fluid collection. There is no bowel obstruction. Vascular/Lymphatic: The abdominal aorta and IVC are unremarkable. No portal venous gas. There is no adenopathy. Reproductive: The uterus is retroverted or retroflexed. No adnexal masses. Other: None Musculoskeletal: No acute or significant osseous findings. IMPRESSION: 1. Inflammatory changes of the pelvis centered around a focus adjacent to the tip of the appendix. Findings may represent tip appendicitis or an epiploic appendagitis. Clinical correlation is recommended. No drainable fluid collection. 2. Fatty liver. Electronically Signed   By: Elgie CollardArash  Radparvar M.D.   On: 08/25/2019 23:35    ROS - all of the below systems have been reviewed with the patient and positives are indicated with bold text General: chills, fever or night sweats Eyes: blurry vision or double vision ENT: epistaxis or sore throat Allergy/Immunology: itchy/watery eyes or nasal congestion Hematologic/Lymphatic: bleeding problems, blood clots or swollen lymph nodes Endocrine: temperature intolerance or unexpected weight changes Breast: new or changing breast lumps or nipple discharge Resp: cough, shortness of breath, or wheezing CV: chest pain or dyspnea on exertion GI: as per HPI GU: dysuria, trouble voiding, or hematuria MSK: joint pain or joint stiffness Neuro: TIA or stroke symptoms Derm: pruritus and skin lesion changes Psych: anxiety and depression  PE Blood pressure 108/72, pulse 65, temperature 98.1 F (36.7 C), temperature source Oral, resp. rate 19, height 5\' 5"  (1.651 m), weight 92.9 kg, last menstrual period 08/12/2019, SpO2 98 %, unknown if currently breastfeeding. Constitutional: NAD; conversant; no deformities; wearing mask Eyes: Moist conjunctiva; no lid lag; anicteric; pupils equal and round Neck: Trachea midline; no thyromegaly Lungs: Normal respiratory effort; no  tactile fremitus CV: RRR; no palpable thrills; no pitting edema GI: Abd soft, focally ttp in RLQ with focal guarding; no guarding/rebound elsewhere; no palpable hepatosplenomegaly MSK: Normal range of motion of extremities; no clubbing/cyanosis Psychiatric: Appropriate affect; alert and oriented x3 Lymphatic: No palpable cervical or axillary lymphadenopathy  Results for orders placed or performed during the hospital encounter of 08/25/19 (from the past  48 hour(s))  Urinalysis, Routine w reflex microscopic     Status: Abnormal   Collection Time: 08/25/19  9:25 PM  Result Value Ref Range   Color, Urine YELLOW YELLOW   APPearance CLEAR CLEAR   Specific Gravity, Urine 1.020 1.005 - 1.030   pH 8.5 (H) 5.0 - 8.0   Glucose, UA NEGATIVE NEGATIVE mg/dL   Hgb urine dipstick TRACE (A) NEGATIVE   Bilirubin Urine NEGATIVE NEGATIVE   Ketones, ur NEGATIVE NEGATIVE mg/dL   Protein, ur NEGATIVE NEGATIVE mg/dL   Nitrite NEGATIVE NEGATIVE   Leukocytes,Ua NEGATIVE NEGATIVE    Comment: Performed at [facility], [address] [place], [place] [address]  Pregnancy, urine     Status: None   Collection Time: 08/25/19  9:25 PM  Result Value Ref Range   Preg Test, Ur NEGATIVE NEGATIVE    Comment:        THE SENSITIVITY OF THIS METHODOLOGY IS >20 mIU/mL. Performed at [facility], [address] [place], [place] [address]   Urinalysis, Microscopic (reflex)     Status: Abnormal   Collection Time: 08/25/19  9:25 PM  Result Value Ref Range   RBC / HPF 0-5 0 - 5 RBC/hpf   WBC, UA 0-5 0 - 5 WBC/hpf   Bacteria, UA FEW (A) NONE SEEN   Squamous Epithelial / LPF 6-10 0 - 5   Amorphous Crystal PRESENT     Comment: Performed at [facility], [address] [place], [place] [address]  CBC with Differential     Status: Abnormal   Collection Time: 08/25/19 10:33 PM  Result Value Ref Range   WBC 11.6 (H) 4.0 - 10.5 K/uL   RBC 4.38 3.87 - 5.11 MIL/uL   Hemoglobin 12.7 12.0 -  15.0 g/dL   HCT 37.9 36.0 - 46.0 %   MCV 86.5 80.0 - 100.0 fL   MCH 29.0 26.0 - 34.0 pg   MCHC 33.5 30.0 - 36.0 g/dL   RDW 13.5 11.5 - 15.5 %   Platelets 242 150 - 400 K/uL   nRBC 0.0 0.0 - 0.2 %   Neutrophils Relative % 72 %   Neutro Abs 8.4 (H) 1.7 - 7.7 K/uL   Lymphocytes Relative 19 %   Lymphs Abs 2.2 0.7 - 4.0 K/uL   Monocytes Relative 7 %   Monocytes Absolute 0.8 0.1 - 1.0 K/uL   Eosinophils Relative 2 %   Eosinophils Absolute 0.2 0.0 - 0.5 K/uL   Basophils Relative 0 %   Basophils Absolute 0.0 0.0 - 0.1 K/uL   Immature Granulocytes 0 %   Abs Immature Granulocytes 0.03 0.00 - 0.07 K/uL    Comment: Performed at [facility], [address] [place], [place] [address]  Comprehensive metabolic panel     Status: Abnormal   Collection Time: 08/25/19 10:33 PM  Result Value Ref Range   Sodium 138 135 - 145 mmol/L   Potassium 3.3 (L) 3.5 - 5.1 mmol/L   Chloride 106 98 - 111 mmol/L   CO2 22 22 - 32 mmol/L   Glucose, Bld 125 (H) 70 - 99 mg/dL    Comment: Glucose reference range applies only to samples taken after fasting for at least 8 hours.   BUN 11 6 - 20 mg/dL   Creatinine, Ser 0.84 0.44 - 1.00 mg/dL   Calcium 8.7 (L) 8.9 - 10.3 mg/dL   Total Protein 7.1 6.5 - 8.1 g/dL  Albumin 3.5 3.5 - 5.0 g/dL   AST 17 15 - 41 U/L   ALT 21 0 - 44 U/L   Alkaline Phosphatase 55 38 - 126 U/L   Total Bilirubin 0.4 0.3 - 1.2 mg/dL   GFR calc non Af Amer >60 >60 mL/min   GFR calc Af Amer >60 >60 mL/min   Anion gap 10 5 - 15    Comment: Performed at [facility], [address] [place], [place] [address]  Lipase, blood     Status: None   Collection Time: 08/25/19 10:33 PM  Result Value Ref Range   Lipase 33 11 - 51 U/L    Comment: Performed at [facility], [address] [place], [place] [address]  SARS Coronavirus 2 by RT PCR (hospital order, performed in [facility] hospital lab) Nasopharyngeal Nasopharyngeal Swab     Status: None   Collection Time:  08/26/19  1:40 AM   Specimen: Nasopharyngeal Swab  Result Value Ref Range   SARS Coronavirus 2 NEGATIVE NEGATIVE    Comment: (NOTE) SARS-CoV-2 target nucleic acids are NOT DETECTED. The SARS-CoV-2 RNA is generally detectable in upper and lower respiratory specimens during the acute phase of infection. The lowest concentration of SARS-CoV-2 viral copies this assay can detect is 250 copies / mL. A negative result does not preclude SARS-CoV-2 infection and should not be used as the sole basis for treatment or other patient management decisions.  A negative result may occur with improper specimen collection / handling, submission of specimen other than nasopharyngeal swab, presence of viral mutation(s) within the areas targeted by this assay, and inadequate number of viral copies (<250 copies / mL). A negative result must be combined with clinical observations, patient history, and epidemiological information. Fact Sheet for Patients:   BoilerBrush.com.cy Fact Sheet for Healthcare Providers: https://pope.com/ This test is not yet approved or cleared  by the [place] FDA and has been authorized for detection and/or diagnosis of SARS-CoV-2 by FDA under an Emergency Use Authorization (EUA).  This EUA will remain in effect (meaning this test can be used) for the duration of the COVID-19 declaration under Section 564(b)(1) of the Act, 21 U.S.C. section 360bbb-3(b)(1), unless the authorization is terminated or revoked sooner. Performed at [facility], [address], [place], [place] [address]   Surgical PCR screen     Status: None   Collection Time: 08/26/19  3:53 AM   Specimen: Nasal Mucosa; Nasal Swab  Result Value Ref Range   MRSA, PCR NEGATIVE NEGATIVE   Staphylococcus aureus NEGATIVE NEGATIVE    Comment: (NOTE) The Xpert SA Assay (FDA approved for NASAL specimens in patients 29 years of age and older), is one component  of a comprehensive surveillance program. It is not intended to diagnose infection nor to guide or monitor treatment. Performed at [facility], [address], [place], [place] [address]     CT ABDOMEN PELVIS W CONTRAST  Result Date: 08/25/2019 CLINICAL DATA:  34 year old female with lower abdominal pain. Concern for acute diverticulitis. EXAM: CT ABDOMEN AND PELVIS WITH CONTRAST TECHNIQUE: Multidetector CT imaging of the abdomen and pelvis was performed using the standard protocol following bolus administration of intravenous contrast. CONTRAST:  OMNIPAQUE IOHEXOL 300 MG/ML  SOLN COMPARISON:  None. FINDINGS: Lower chest: The visualized lung bases are clear. No intra-abdominal free air or free fluid. Hepatobiliary: Fatty infiltration of the liver. No intrahepatic biliary ductal dilatation. The gallbladder is unremarkable.  Pancreas: Unremarkable. No pancreatic ductal dilatation or surrounding inflammatory changes. Spleen: Normal in size without focal abnormality. Adrenals/Urinary Tract: The adrenal glands are unremarkable. The kidneys, visualized ureters, and urinary bladder appear unremarkable. Stomach/Bowel: There is a 10 x 13 mm focus of inflammation within the posterior pelvis to the right of the midline (70/2 and 68/5). This inflammatory focus is located at the tip of the appendix. This may represent a tip appendicitis with possible rupture or phlegmonous changes or an epiploic appendagitis. Clinical correlation is recommended. No drainable fluid collection. There is no bowel obstruction. Vascular/Lymphatic: The abdominal aorta and IVC are unremarkable. No portal venous gas. There is no adenopathy. Reproductive: The uterus is retroverted or retroflexed. No adnexal masses. Other: None Musculoskeletal: No acute or significant osseous findings. IMPRESSION: 1. Inflammatory changes of the pelvis centered around a focus adjacent to the tip of the appendix. Findings may represent tip  appendicitis or an epiploic appendagitis. Clinical correlation is recommended. No drainable fluid collection. 2. Fatty liver. Electronically Signed   By: Elgie Collard M.D.   On: 08/25/2019 23:35   A/P: Angle Dirusso is an 34 y.o. female with 4d hx of worsening RLQ pain; fever/chills; leukocytosis and CT showing what is most consistent with appendicitis involving tip  -The anatomy and physiology of the GI tract was discussed at length with the patient. The pathophysiology of appendicitis was discussed at length as well. -I discussed options moving forward for treatment including observation with IV abx vs surgery. We discussed that with antibiotics alone, there is reasonable success in managing appendicitis but with risks of recurrence at 72yrs being as high as 40% in some studies. We discussed surgical procedure including laparoscopic and potential open techniques. We discussed the material risks (including, but not limited to, pain, bleeding, infection, scarring, need for blood transfusion, damage to surrounding structures- blood vessels/nerves/viscus/organs, damage to ureter/bladder, urine leak, leak from staple line, need for additional procedures, hernia, recurrence although quite low, pneumonia, heart attack, stroke, death) benefits and alternatives to surgery were discussed at length. The patient's questions were answered to her satisfaction, she voiced understanding and elected to proceed with surgery. Additionally, we discussed typical postoperative expectations and the recovery process. -We discussed her case would be reviewed by my on call partner, Dr. Daphine Deutscher and surgery with him  Stephanie Coup. Cliffton Asters, M.D. [facility]

## 2019-08-26 NOTE — Interval H&P Note (Signed)
History and Physical Interval Note:  08/26/2019 10:52 AM  Angela Rangel  has presented today for surgery, with the diagnosis of appencicitis.  The various methods of treatment have been discussed with the patient and family. After consideration of risks, benefits and other options for treatment, the patient has consented to  Procedure(s): APPENDECTOMY LAPAROSCOPIC (N/A) as a surgical intervention.  The patient's history has been reviewed, patient examined, no change in status, stable for surgery.  I have reviewed the patient's chart and labs.  Questions were answered to the patient's satisfaction.     Valarie Merino

## 2019-08-26 NOTE — ED Provider Notes (Signed)
  Patient arrived from med [facility].  Concern for appendicitis on CT scan.  She continues to report 10 abdominal pain.  She has some point tenderness in the right lower quadrant.  She is overall nontoxic and vital signs are reassuring.  Will let general surgery, Dr. Cliffton Asters know of her arrival.  Physical Exam  BP 101/77   Pulse 80   Temp 98.7 F (37.1 C)   Resp 13   Ht 1.651 m (5\' 5" )   Wt 91.6 kg   LMP 08/12/2019   SpO2 99%   BMI 33.60 kg/m   Physical Exam Nontoxic, ABCs intact Tenderness palpation right lower quadrant without rebound or guarding  ED Course/Procedures         08/14/2019, Wilkie Aye, MD 08/26/19 0234

## 2019-08-27 ENCOUNTER — Encounter: Payer: Self-pay | Admitting: *Deleted

## 2019-08-27 LAB — URINE CULTURE

## 2019-08-27 LAB — SURGICAL PATHOLOGY

## 2019-08-27 MED ORDER — SODIUM CHLORIDE 0.9 % IV SOLN
2.0000 g | Freq: Once | INTRAVENOUS | Status: AC
  Administered 2019-08-27: 2 g via INTRAVENOUS
  Filled 2019-08-27: qty 2

## 2019-08-27 MED ORDER — ACETAMINOPHEN 500 MG PO TABS: 1000.0000 mg | ORAL_TABLET | Freq: Three times a day (TID) | ORAL | Status: DC | PRN

## 2019-08-27 MED ORDER — IBUPROFEN 200 MG PO TABS: 600.0000 mg | ORAL_TABLET | Freq: Four times a day (QID) | ORAL | Status: DC | PRN

## 2019-08-27 MED ORDER — SIMETHICONE 80 MG PO CHEW: 40.0000 mg | CHEWABLE_TABLET | Freq: Four times a day (QID) | ORAL | Status: DC | PRN

## 2019-08-27 MED ORDER — KETOROLAC TROMETHAMINE 30 MG/ML IJ SOLN
15.0000 mg | Freq: Once | INTRAMUSCULAR | Status: AC
  Administered 2019-08-27: 15 mg via INTRAVENOUS
  Filled 2019-08-27: qty 1

## 2019-08-27 MED ORDER — OXYCODONE HCL 5 MG PO TABS: 5.0000 mg | ORAL_TABLET | Freq: Four times a day (QID) | ORAL | 0 refills | Status: DC | PRN

## 2019-08-27 NOTE — Progress Notes (Signed)
Reviewed discharge instructions with Pt and family using translator [phone].

## 2019-08-27 NOTE — Progress Notes (Signed)
[facility] Progress Note  1 Day Post-Op  Subjective: Patient reports some generalized soreness this AM. Has been up and showered already. She had some nausea this AM but is hungry now. She reports some hematuria. No flatus.   Objective: Vital signs in last 24 hours: Temp:  [97.8 F (36.6 C)-99.4 F (37.4 C)] 98.1 F (36.7 C) (06/02 0539) Pulse Rate:  [62-88] 64 (06/02 0539) Resp:  [12-21] 17 (06/02 0539) BP: (93-133)/(66-105) 98/68 (06/02 0539) SpO2:  [94 %-100 %] 100 % (06/02 0539) Last BM Date: 08/24/19  Intake/Output from previous day: 06/01 0701 - 06/02 0700 In: 3260.8 [P.O.:180; I.V.:2926.7; IV Piggyback:154.1] Out: 3510 [Urine:3500; Blood:10] Intake/Output this shift: No intake/output data recorded.  PE: General: pleasant, WD, overweight Hispanic female, NAD Heart: regular, rate, and rhythm.  Normal s1,s2. No obvious murmurs, gallops, or rubs noted.  Palpable radial and pedal pulses bilaterally Lungs: CTAB, no wheezes, rhonchi, or rales noted.  Respiratory effort nonlabored Abd: soft, appropriately ttp, mildly distended, +BS, incisions c/d/i    Lab Results:  Recent Labs    08/25/19 2233 08/26/19 0346  WBC 11.6* 10.5  HGB 12.7 11.5*  HCT 37.9 37.5  PLT 242 230   BMET Recent Labs    08/25/19 2233 08/26/19 0346  NA 138  --   K 3.3*  --   CL 106  --   CO2 22  --   GLUCOSE 125*  --   BUN 11  --   CREATININE 0.84 0.62  CALCIUM 8.7*  --    PT/INR No results for input(s): LABPROT, INR in the last 72 hours. CMP     Component Value Date/Time   NA 138 08/25/2019 2233   K 3.3 (L) 08/25/2019 2233   CL 106 08/25/2019 2233   CO2 22 08/25/2019 2233   GLUCOSE 125 (H) 08/25/2019 2233   BUN 11 08/25/2019 2233   CREATININE 0.62 08/26/2019 0346   CREATININE 0.58 08/08/2013 0910   CALCIUM 8.7 (L) 08/25/2019 2233   PROT 7.1 08/25/2019 2233   ALBUMIN 3.5 08/25/2019 2233   AST 17 08/25/2019 2233   ALT 21 08/25/2019 2233   ALKPHOS 55 08/25/2019  2233   BILITOT 0.4 08/25/2019 2233   GFRNONAA >60 08/26/2019 0346   GFRNONAA >89 08/08/2013 0910   GFRAA >60 08/26/2019 0346   GFRAA >89 08/08/2013 0910   Lipase     Component Value Date/Time   LIPASE 33 08/25/2019 2233       Studies/Results: CT ABDOMEN PELVIS W CONTRAST  Result Date: 08/25/2019 CLINICAL DATA:  34 year old female with lower abdominal pain. Concern for acute diverticulitis. EXAM: CT ABDOMEN AND PELVIS WITH CONTRAST TECHNIQUE: Multidetector CT imaging of the abdomen and pelvis was performed using the standard protocol following bolus administration of intravenous contrast. CONTRAST:  OMNIPAQUE IOHEXOL 300 MG/ML  SOLN COMPARISON:  None. FINDINGS: Lower chest: The visualized lung bases are clear. No intra-abdominal free air or free fluid. Hepatobiliary: Fatty infiltration of the liver. No intrahepatic biliary ductal dilatation. The gallbladder is unremarkable. Pancreas: Unremarkable. No pancreatic ductal dilatation or surrounding inflammatory changes. Spleen: Normal in size without focal abnormality. Adrenals/Urinary Tract: The adrenal glands are unremarkable. The kidneys, visualized ureters, and urinary bladder appear unremarkable. Stomach/Bowel: There is a 10 x 13 mm focus of inflammation within the posterior pelvis to the right of the midline (70/2 and 68/5). This inflammatory focus is located at the tip of the appendix. This may represent a tip appendicitis with possible rupture or phlegmonous changes or  an epiploic appendagitis. Clinical correlation is recommended. No drainable fluid collection. There is no bowel obstruction. Vascular/Lymphatic: The abdominal aorta and IVC are unremarkable. No portal venous gas. There is no adenopathy. Reproductive: The uterus is retroverted or retroflexed. No adnexal masses. Other: None Musculoskeletal: No acute or significant osseous findings. IMPRESSION: 1. Inflammatory changes of the pelvis centered around a focus adjacent to the tip of  the appendix. Findings may represent tip appendicitis or an epiploic appendagitis. Clinical correlation is recommended. No drainable fluid collection. 2. Fatty liver. Electronically Signed   By: Anner Crete M.D.   On: 08/25/2019 23:35    Anti-infectives: Anti-infectives (From admission, onward)   Start     Dose/Rate Route Frequency Ordered Stop   08/26/19 0600  cefTRIAXone (ROCEPHIN) 2 g in sodium chloride 0.9 % 100 mL IVPB  Status:  Discontinued     2 g 200 mL/hr over 30 Minutes Intravenous Every 24 hours 08/26/19 0324 08/26/19 1511   08/26/19 0600  metroNIDAZOLE (FLAGYL) IVPB 500 mg  Status:  Discontinued     500 mg 100 mL/hr over 60 Minutes Intravenous Every 8 hours 08/26/19 0324 08/26/19 1511   08/26/19 0100  piperacillin-tazobactam (ZOSYN) IVPB 3.375 g     3.375 g 100 mL/hr over 30 Minutes Intravenous  Once 08/26/19 0057 08/26/19 0159       Assessment/Plan Acute appendicitis S/p lap appendectomy 08/26/19 Dr. Hassell Done - POD#1 - pt a little nauseated this AM but asking for food - ambulate and ADAT - incisions c/d/i - possible discharge home this afternoon if feeling ok - discussed post-op instructions with patient  Hematuria - likely just trauma from foley, continue IVF until d/c, give one more dose rocephin   FEN: reg diet, IVF VTE: lovenox ID: rocephin/flagyl 6/1, rocephin 6/2  LOS: 0 days    Norm Parcel , [facility] 08/27/2019, 8:45 AM Please see Amion for pager number during day hours 7:00am-4:30pm

## 2019-08-27 NOTE — Anesthesia Postprocedure Evaluation (Signed)
Anesthesia Post Note  [name]  Procedure(s) Performed: APPENDECTOMY LAPAROSCOPIC (N/A )     Patient location during evaluation: PACU Anesthesia Type: General Level of consciousness: sedated Pain management: pain level controlled Vital Signs Assessment: post-procedure vital signs reviewed and stable Respiratory status: spontaneous breathing Cardiovascular status: stable Anesthetic complications: yes Anesthetic complication details: PONV   Last Vitals:  Vitals:   08/27/19 0944 08/27/19 1253  BP: 106/81 112/67  Pulse: 66 62  Resp: 20 20  Temp: 36.6 C 36.5 C  SpO2: 100% 100%    Last Pain:  Vitals:   08/27/19 1456  TempSrc:   PainSc: 3    Pain Goal: Patients Stated Pain Goal: 2 (08/27/19 1400)                 Caren Macadam

## 2019-08-27 NOTE — Discharge Summary (Signed)
[facility] Discharge Summary   [name] MRN: [id number] DOB/AGE: 1985-10-18 34 y.o.  Admit date: 08/25/2019 Discharge date: 08/27/2019  Admitting Diagnosis: Appendicitis  Discharge Diagnosis Acute appendicitis   Consultants None  Imaging: CT ABDOMEN PELVIS W CONTRAST  Result Date: 08/25/2019 CLINICAL DATA:  34 year old female with lower abdominal pain. Concern for acute diverticulitis. EXAM: CT ABDOMEN AND PELVIS WITH CONTRAST TECHNIQUE: Multidetector CT imaging of the abdomen and pelvis was performed using the standard protocol following bolus administration of intravenous contrast. CONTRAST:  121mL OMNIPAQUE IOHEXOL 300 MG/ML  SOLN COMPARISON:  None. FINDINGS: Lower chest: The visualized lung bases are clear. No intra-abdominal free air or free fluid. Hepatobiliary: Fatty infiltration of the liver. No intrahepatic biliary ductal dilatation. The gallbladder is unremarkable. Pancreas: Unremarkable. No pancreatic ductal dilatation or surrounding inflammatory changes. Spleen: Normal in size without focal abnormality. Adrenals/Urinary Tract: The adrenal glands are unremarkable. The kidneys, visualized ureters, and urinary bladder appear unremarkable. Stomach/Bowel: There is a 10 x 13 mm focus of inflammation within the posterior pelvis to the right of the midline (70/2 and 68/5). This inflammatory focus is located at the tip of the appendix. This may represent a tip appendicitis with possible rupture or phlegmonous changes or an epiploic appendagitis. Clinical correlation is recommended. No drainable fluid collection. There is no bowel obstruction. Vascular/Lymphatic: The abdominal aorta and IVC are unremarkable. No portal venous gas. There is no adenopathy. Reproductive: The uterus is retroverted or retroflexed. No adnexal masses. Other: None Musculoskeletal: No acute or significant osseous findings. IMPRESSION: 1. Inflammatory changes of the pelvis centered  around a focus adjacent to the tip of the appendix. Findings may represent tip appendicitis or an epiploic appendagitis. Clinical correlation is recommended. No drainable fluid collection. 2. Fatty liver. Electronically Signed   By: Anner Crete M.D.   On: 08/25/2019 23:35    Procedures Dr. Kaylyn Lim (08/26/19) - Laparoscopic Appendectomy  Hospital Course:  Patient is a 33 year old female who presented to [facility] with abdominal pain.  Workup showed acute appendicitis.  Patient was admitted and underwent procedure listed above.  Tolerated procedure well and was transferred to the floor.  Diet was advanced as tolerated.  On POD#1, the patient was voiding well, tolerating diet, ambulating well, pain well controlled, vital signs stable, incisions c/d/i and felt stable for discharge home.  Patient will follow up in our office in 3 weeks and knows to call with questions or concerns.  She will call to confirm appointment date/time.    I or a member of my team have reviewed this patient in the Controlled Substance Database.   Allergies as of 08/27/2019      Reactions   Latex Itching   Itching and hives      Medication List    TAKE these medications   acetaminophen 500 MG tablet Commonly known as: TYLENOL Take 2 tablets (1,000 mg total) by mouth every 8 (eight) hours as needed for mild pain.   ibuprofen 200 MG tablet Commonly known as: ADVIL Take 3 tablets (600 mg total) by mouth every 6 (six) hours as needed (for mild pain not relieved by other medications.).   oxyCODONE 5 MG immediate release tablet Commonly known as: Oxy IR/ROXICODONE Take 1-2 tablets (5-10 mg total) by mouth every 6 (six) hours as needed for moderate pain.   simethicone 80 MG chewable tablet Commonly known as: MYLICON Chew 0.5 tablets (40 mg total) by mouth every 6 (six) hours as needed for flatulence (  bloating).        Follow-up Information    [facility] Follow up on 09/16/2019.   Specialty:  General Surgery Why: Your appointment is at 9:15 AM.  Be at the office 30 minutes early for check-in.  Bring photo ID and insurance information. If possible bring family member who speaks [place]. Contact information: [address] [place] [address] [phone]           Signed: Wells Guiles, [facility] 08/27/2019, 2:54 PM Please see Amion for pager number during day hours 7:00am-4:30pm

## 2019-08-27 NOTE — Progress Notes (Signed)
Discharge instructions given to pt and husband and all questions were answered.  

## 2019-08-31 LAB — CULTURE, BLOOD (ROUTINE X 2)
Culture: NO GROWTH
Special Requests: ADEQUATE

## 2021-04-18 ENCOUNTER — Inpatient Hospital Stay (HOSPITAL_COMMUNITY)
Admission: EM | Admit: 2021-04-18 | Discharge: 2021-04-22 | DRG: 103 | Disposition: A | Discharge status: Home / Self Care | Payer: Self-pay | Attending: Internal Medicine | Admitting: Internal Medicine

## 2021-04-18 ENCOUNTER — Encounter (HOSPITAL_COMMUNITY): Payer: Self-pay

## 2021-04-18 ENCOUNTER — Ambulatory Visit (HOSPITAL_COMMUNITY)
Admission: EM | Admit: 2021-04-18 | Discharge: 2021-04-18 | Disposition: A | Discharge status: Home / Self Care | Payer: Self-pay | Attending: Internal Medicine | Admitting: Internal Medicine

## 2021-04-18 ENCOUNTER — Other Ambulatory Visit: Payer: Self-pay

## 2021-04-18 ENCOUNTER — Encounter (HOSPITAL_COMMUNITY): Payer: Self-pay | Admitting: *Deleted

## 2021-04-18 ENCOUNTER — Emergency Department (HOSPITAL_COMMUNITY): Payer: Self-pay

## 2021-04-18 DIAGNOSIS — K5900 Constipation, unspecified: Secondary | ICD-10-CM | POA: Diagnosis present

## 2021-04-18 DIAGNOSIS — R519 Headache, unspecified: Principal | ICD-10-CM

## 2021-04-18 DIAGNOSIS — Z91040 Latex allergy status: Secondary | ICD-10-CM

## 2021-04-18 DIAGNOSIS — T380X5A Adverse effect of glucocorticoids and synthetic analogues, initial encounter: Secondary | ICD-10-CM | POA: Diagnosis present

## 2021-04-18 DIAGNOSIS — H547 Unspecified visual loss: Secondary | ICD-10-CM | POA: Diagnosis present

## 2021-04-18 DIAGNOSIS — G441 Vascular headache, not elsewhere classified: Secondary | ICD-10-CM | POA: Diagnosis present

## 2021-04-18 DIAGNOSIS — Z20822 Contact with and (suspected) exposure to covid-19: Secondary | ICD-10-CM | POA: Diagnosis present

## 2021-04-18 DIAGNOSIS — G932 Benign intracranial hypertension: Principal | ICD-10-CM | POA: Diagnosis present

## 2021-04-18 DIAGNOSIS — F329 Major depressive disorder, single episode, unspecified: Secondary | ICD-10-CM | POA: Diagnosis present

## 2021-04-18 LAB — BASIC METABOLIC PANEL
Anion gap: 11 (ref 5–15)
BUN: 13 mg/dL (ref 6–20)
CO2: 27 mmol/L (ref 22–32)
Calcium: 9.5 mg/dL (ref 8.9–10.3)
Chloride: 102 mmol/L (ref 98–111)
Creatinine, Ser: 0.57 mg/dL (ref 0.44–1.00)
GFR, Estimated: >60 mL/min (ref >60)
Glucose, Bld: 104 mg/dL — ABNORMAL HIGH (ref 70–99)
Potassium: 4 mmol/L (ref 3.5–5.1)
Sodium: 140 mmol/L (ref 135–145)

## 2021-04-18 LAB — CBC
HCT: 45.4 % (ref 36.0–46.0)
Hemoglobin: 14.8 g/dL (ref 12.0–15.0)
MCH: 29.2 pg (ref 26.0–34.0)
MCHC: 32.6 g/dL (ref 30.0–36.0)
MCV: 89.7 fL (ref 80.0–100.0)
Platelets: 312 10*3/uL (ref 150–400)
RBC: 5.06 MIL/uL (ref 3.87–5.11)
RDW: 12.5 % (ref 11.5–15.5)
WBC: 10.8 10*3/uL — ABNORMAL HIGH (ref 4.0–10.5)
nRBC: 0 % (ref 0.0–0.2)

## 2021-04-18 IMAGING — CT CT HEAD W/O CM
3 series · 15 of 46 positions shown, 18 images · non-contrast
Comparison: Head CT [DATE]

CLINICAL DATA: Headache, sudden, severe

Dizziness and vomiting.
EXAM:
CT HEAD WITHOUT CONTRAST
TECHNIQUE: Contiguous axial images were obtained from the base of the skull
through the vertex without intravenous contrast.
RADIATION DOSE REDUCTION: This exam was performed according to the
departmental dose-optimization program which includes automated
exposure control, adjustment of the mA and/or kV according to
patient size and/or use of iterative reconstruction technique.

[Series 2: head wo · axial · 0.42mm/px · z∈[+1406,+1526]mm · 9 of 29 slices shown, 12 images]
[im 3/29  brain]
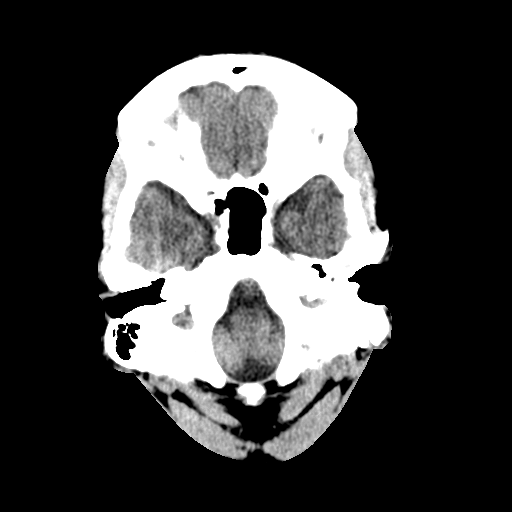
[im 3/29  bone]
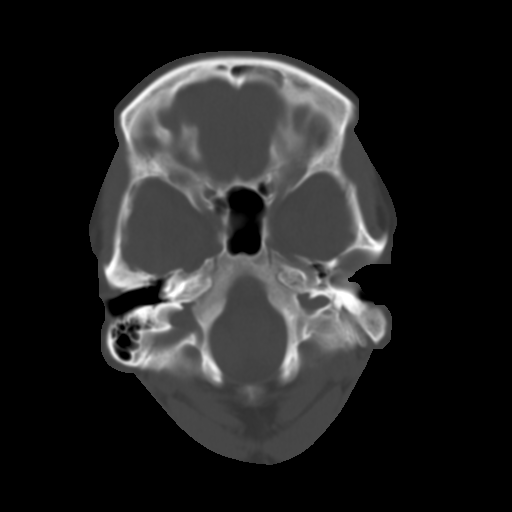
[im 6/29  brain]
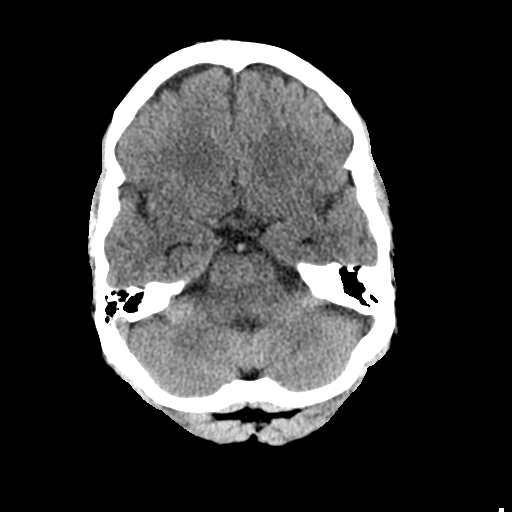
[im 9/29  brain]
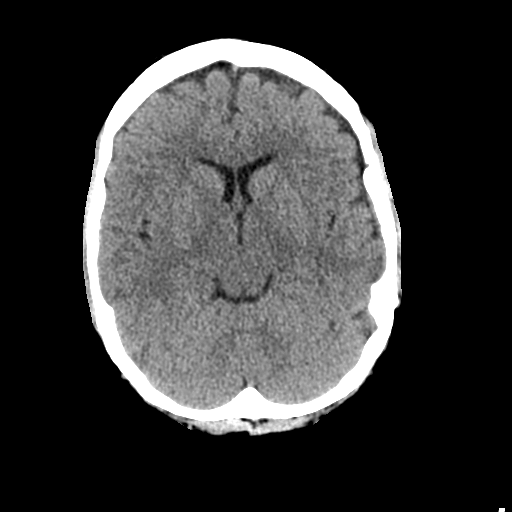
[im 12/29  brain]
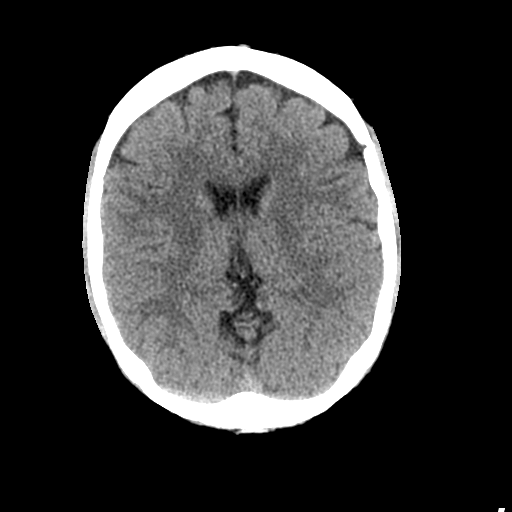
[im 15/29  brain]
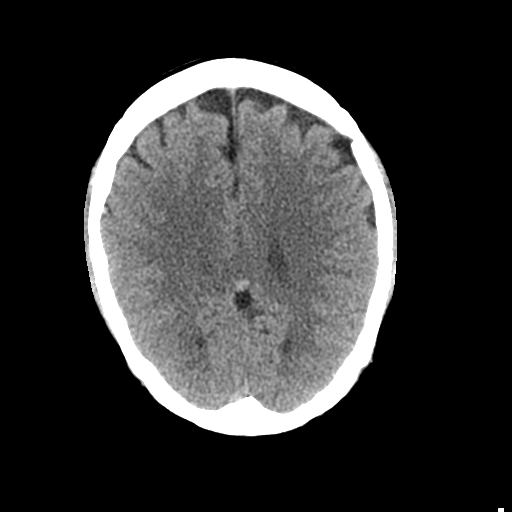
[im 15/29  bone]
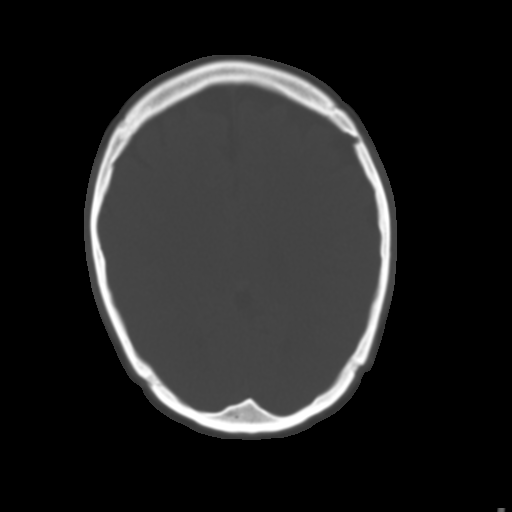
[im 18/29  brain]
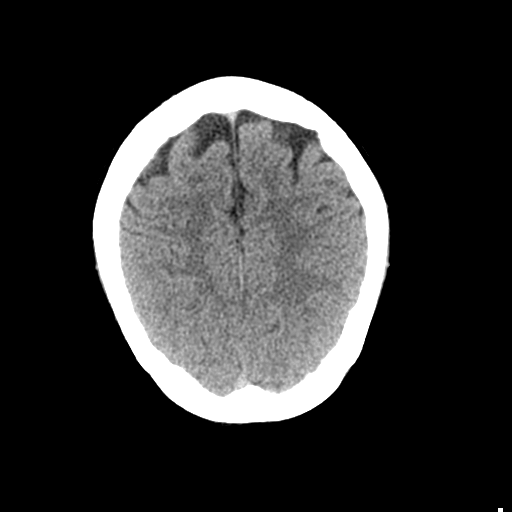
[im 21/29  brain]
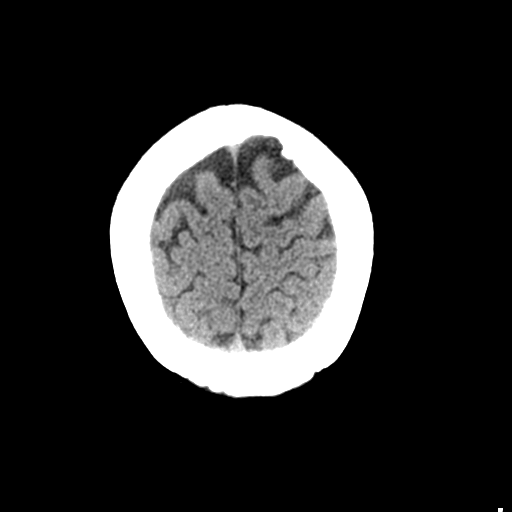
[im 24/29  brain]
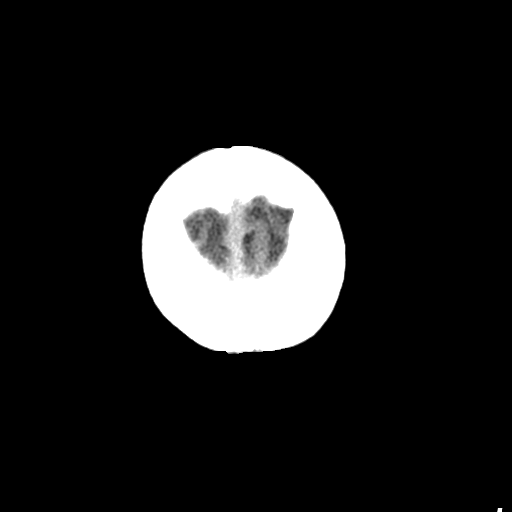
[im 27/29  brain]
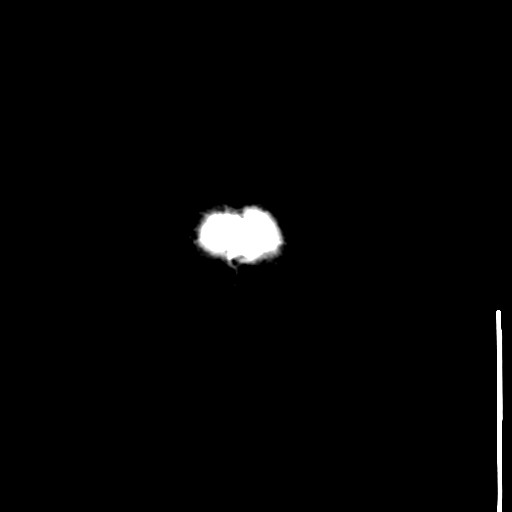
[im 27/29  bone]
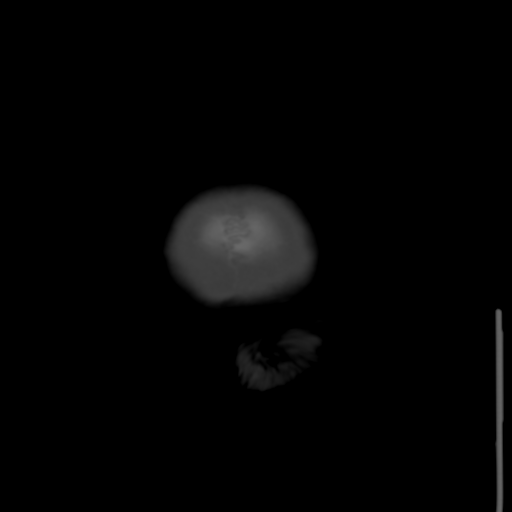

[Series 4: coronal soft tissue · coronal · 0.30mm/px · 3 of 63 slices shown]
[im 22/63  brain]
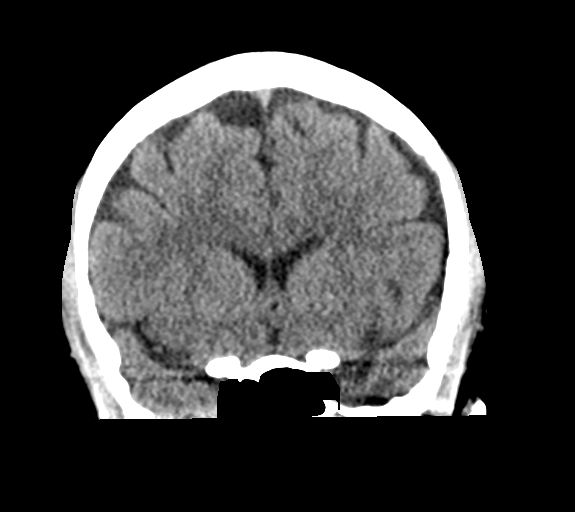
[im 28/63  brain]
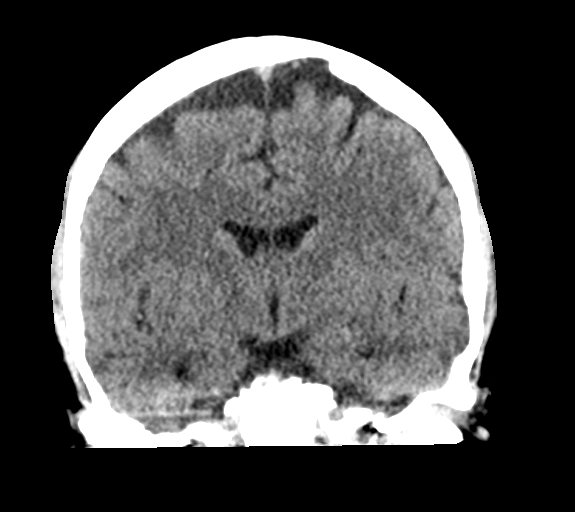
[im 35/63  brain]
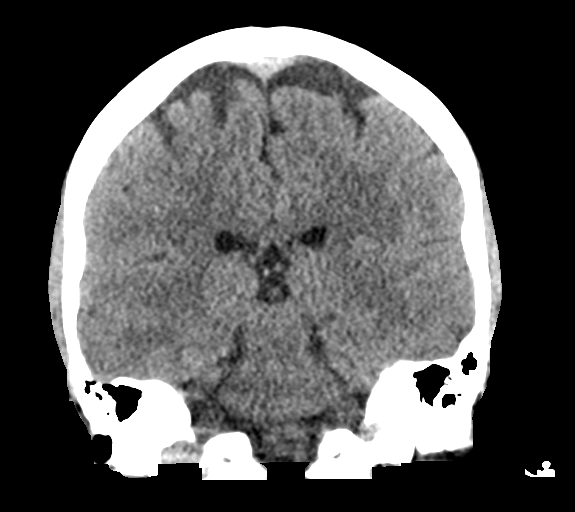

[Series 5: sagittal soft tissue · sagittal · 0.30mm/px · 3 of 59 slices shown]
[im 20/59  brain]
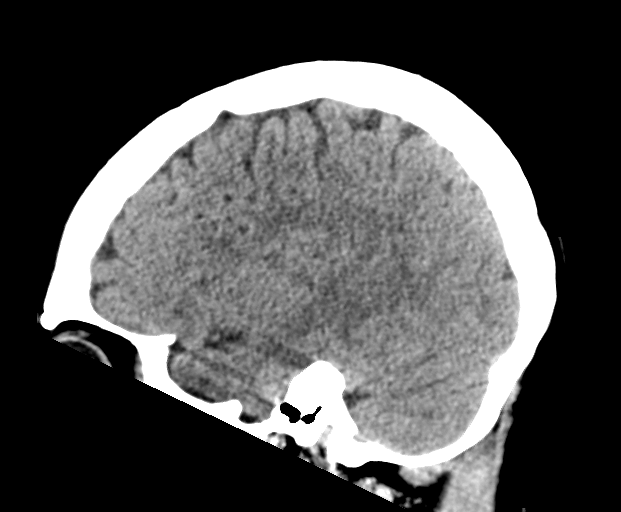
[im 30/59  brain]
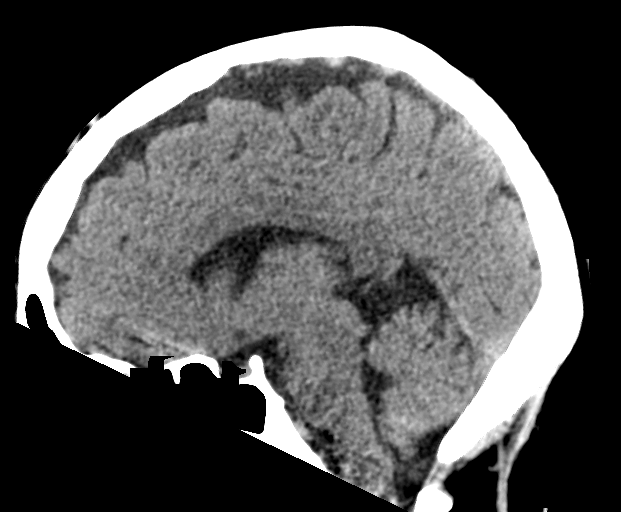
[im 39/59  brain]
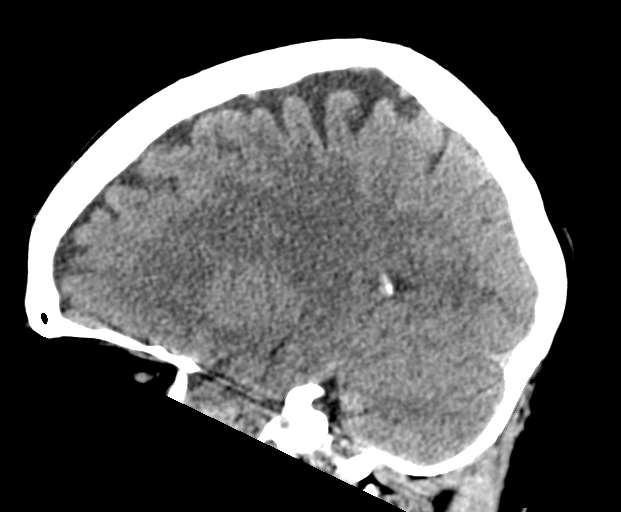

[15 of 46 positions shown; findings below may reference images not displayed]

FINDINGS: Brain: No intracranial hemorrhage, mass effect, or midline shift. No
hydrocephalus. The basilar cisterns are patent. No evidence of
territorial infarct or acute ischemia. No extra-axial or
intracranial fluid collection.

Vascular: No hyperdense vessel or unexpected calcification.

Skull: No fracture or focal lesion.

Sinuses/Orbits: Occasional mucosal thickening of included ethmoid
air cells. No acute orbital findings.

Other: None.
IMPRESSION: No acute intracranial abnormality.

## 2021-04-18 MED ORDER — ONDANSETRON 4 MG PO TBDP
8.0000 mg | ORAL_TABLET | Freq: Once | ORAL | Status: AC
  Administered 2021-04-18: 8 mg via ORAL

## 2021-04-18 MED ORDER — ONDANSETRON 4 MG PO TBDP
ORAL_TABLET | ORAL | Status: AC
  Filled 2021-04-18: qty 2

## 2021-04-18 NOTE — ED Triage Notes (Signed)
Pt reports vomiting a lot and feeling dizzy. Pt reports she has alwys had Ha but for the past 3 weeks it is worse. Pt reports HA pain so bad that she felt like her face was twisting. Pt takes tylenol .

## 2021-04-18 NOTE — ED Triage Notes (Signed)
Reports headache x 2 months with nausea, vomiting and intermittent blurriness. Sent from urgent care for further evaluation.

## 2021-04-18 NOTE — ED Provider Notes (Addendum)
Angela Rangel    CSN: [id number] Arrival date & time: 04/18/21  1619      History   Chief Complaint Chief Complaint  Patient presents with   Emesis   Dizziness   Headache  Translator not needed since I speak spanish   HPI Angela Rangel is a 36 y.o. female who presents with the worst HA she has ever had. She started having HA's last summer, but were not often and tolerable. Three weeks ago, started having them daily, and 2 days ago, had bilateral temple sharp pain and loss of her vision and had to pull over since she was driving. She has been up all night with HA which was even worse yesterday during the day. She has only vomited x 2 in the past 24h. She has never been evaluated for her HA's in the past by a neurologist. Today has severe pain on top of her head and feels very dizzy, described as off balance. OTC meds have not helped. Last night she felt her L face was drawing and when she tried to look at herself at the mirror, she could not see due to being blurry. Her husband was not awaken to check her.     Past Medical History:  Diagnosis Date   Medical history non-contributory     Patient Active Problem List   Diagnosis Date Noted   Acute appendicitis 08/26/2019   SVD (spontaneous vaginal delivery) 02/08/2017   Encounter for induction of labor 02/07/2017   Cholestasis during pregnancy in third trimester 02/07/2017   Normal labor 03/27/2015    Past Surgical History:  Procedure Laterality Date   LAPAROSCOPIC APPENDECTOMY N/A 08/26/2019   Procedure: APPENDECTOMY LAPAROSCOPIC;  Surgeon: Johnathan Hausen, MD;  Location: [facility] ORS;  Service: General;  Laterality: N/A;   NO PAST SURGERIES      OB History     Gravida  4   Para  4   Term  4   Preterm      AB      Living  4      SAB      IAB      Ectopic      Multiple  0   Live Births  4            Home Medications    Prior to Admission medications   Medication Sig Start Date End  Date Taking? Authorizing Provider  acetaminophen (TYLENOL) 500 MG tablet Take 2 tablets (1,000 mg total) by mouth every 8 (eight) hours as needed for mild pain. 08/27/19   Norm Parcel, PA-C  ibuprofen (ADVIL) 200 MG tablet Take 3 tablets (600 mg total) by mouth every 6 (six) hours as needed (for mild pain not relieved by other medications.). 08/27/19   Norm Parcel, PA-C  simethicone (MYLICON) 80 MG chewable tablet Chew 0.5 tablets (40 mg total) by mouth every 6 (six) hours as needed for flatulence (bloating). 08/27/19   Norm Parcel, PA-C    Family History History reviewed. No pertinent family history.  Social History Social History   Tobacco Use   Smoking status: Never   Smokeless tobacco: Never  Substance Use Topics   Alcohol use: No   Drug use: No     Allergies   Latex   Review of Systems Review of Systems  Constitutional:  Positive for activity change, appetite change and fatigue. Negative for chills, diaphoresis and fever.  HENT:  Negative for congestion, ear discharge, ear  pain, postnasal drip, sinus pressure and sinus pain.   Eyes:  Positive for visual disturbance. Negative for discharge.  Respiratory:  Negative for cough.   Gastrointestinal:  Positive for nausea and vomiting. Negative for abdominal pain.  Musculoskeletal:  Negative for myalgias.  Skin:  Negative for rash.  Neurological:  Positive for dizziness, numbness and headaches.       The top of her head feels numb    Physical Exam Triage Vital Signs ED Triage Vitals  Enc Vitals Group     BP 04/18/21 1737 124/80     Pulse Rate 04/18/21 1737 70     Resp 04/18/21 1737 18     Temp 04/18/21 1737 98.5 F (36.9 C)     Temp src --      SpO2 04/18/21 1737 99 %     Weight --      Height --      Head Circumference --      Peak Flow --      Pain Score 04/18/21 1741 10     Pain Loc --      Pain Edu? --      Excl. in Angela Rangel? --    Orthostatic VS for the past 24 hrs:  BP- Lying Pulse- Lying BP- Sitting  Pulse- Sitting BP- Standing at 0 minutes Pulse- Standing at 0 minutes  04/18/21 1747 129/82 76 126/86 70 133/90 92    Updated Vital Signs BP 124/80    Pulse 70    Temp 98.5 F (36.9 C)    Resp 18    LMP 04/18/2021    SpO2 99%   Visual Acuity Right Eye Distance:   Left Eye Distance:   Bilateral Distance:    Right Eye Near:   Left Eye Near:    Bilateral Near:     Physical Exam Physical Exam Vitals signs and nursing note reviewed.  Constitutional:      General: SHe is in a lot of pain    Appearance: He is well-developed and normal weight. He is not ill-appearing, toxic-appearing or diaphoretic.  HENT:     Head: Normocephalic, but very tender on top of head, but skin is normal.  Eyes:     Extraocular Movements: Extraocular movements intact.     Pupils: Pupils are equal, round, and reactive to light.  Neck:     Musculoskeletal: Neck supple. No neck rigidity.     Meningeal: Brudzinski's sign absent.  Cardiovascular:     Rate and Rhythm: Normal rate and regular rhythm.     Heart sounds: No murmur.  Pulmonary:     Effort: Pulmonary effort is normal.     Breath sounds: Normal breath sounds. No wheezing, rhonchi or rales.   Musculoskeletal: Normal range of motion.  Lymphadenopathy:     Cervical: No cervical adenopathy.  Skin:    General: Skin is warm and dry.  Neurological: Strenth of upper and lower extremities 5/5    Mental Status: SHe is alert, her speech is normal.     Cranial Nerves: No cranial nerve deficit or facial asymmetry.     Sensory: No sensory deficit.     Motor: No weakness.     Coordination: Romberg sign negative. Coordination is abnormal.     Gait: Gait normal.     Deep Tendon Reflexes: Reflexes normal.     Comments: Normal Romberg, finger to nose. Could not do tandem gait well.  Psychiatric:        Mood and Affect: Mood normal.  Speech: Speech normal.        Behavior: Behavior normal.    UC Treatments / Results  Labs (all labs ordered are  listed, but only abnormal results are displayed) Labs Reviewed - No data to display  EKG   Radiology No results found.  Procedures Procedures (including critical care time)  Medications Ordered in UC Medications  ondansetron (ZOFRAN-ODT) disintegrating tablet 8 mg (8 mg Oral Given 04/18/21 1811)    Initial Impression / Assessment and Plan / UC Course  I have reviewed the triage vital signs and the nursing notes. Severe HA ever Pt was given Zofran 8 mg ODT and advised to go to ER right now to have further work up. Her husband agreed to take her right  now.   Final Clinical Impressions(s) / UC Diagnoses   Final diagnoses:  Sudden onset of severe headache     Discharge Instructions      Vaya a Emergencias hoy, para que le revisen you cabesa con rayos equis especiales hoy mismo.   [facility] Emergency Department 30 minutos de espera Address [phone]. [place], [place] [address]  Emergency [facility] [address],  [place]  [address]   Si tus resultados son normales, entonses tienes que ver a un neurologo para que te traden con Chemical engineer diary para prevenier los dolores de [place].     ED Prescriptions   None    PDMP not reviewed this encounter.   Shelby Mattocks, PA-C 04/18/21 [place], Tovah Slavick, PA-C 04/18/21 1837

## 2021-04-18 NOTE — ED Provider Triage Note (Signed)
Emergency Medicine Provider Triage Evaluation Note  Angela Rangel , a 36 y.o. female  was evaluated in triage.  Pt complains of headache. States that she has been having worsening headaches since last summer. 15 days ago she had a headache that was sudden in onset and had associated vision loss in bilateral eyes for a brief period of time which resolved. Saturday night she states that she had sudden onset worst headache of her life which has stayed the same since onset. She states that she has also had blurred vision in both eyes intermittently since onset. Was sent from UC for further evaluation. Denies fevers, chills, n/v/d  Review of Systems  Positive:  Negative: See above  Physical Exam  BP (!) 126/93    Pulse 61    Temp 98.1 F (36.7 C)    Resp 18    Ht 5\' 5"  (1.651 m)    Wt 77.1 kg    LMP 04/18/2021    SpO2 99%    BMI 28.29 kg/m  Gen:   Awake, no distress   Resp:  Normal effort  MSK:   Moves extremities without difficulty  Other:  PERRLA, EOMs intact. Bitemporal hemianopsia possible appreciated on visual fields testing, recommend re-evaluation as translator was used and these findings could be due to breakdown in communication from this  Medical Decision Making  Medically screening exam initiated at 8:06 PM.  Appropriate orders placed.  Donetta Niemeyer was informed that the remainder of the evaluation will be completed by another provider, this initial triage assessment does not replace that evaluation, and the importance of remaining in the ED until their evaluation is complete.     Nestor Lewandowsky 04/18/21 2011

## 2021-04-18 NOTE — Discharge Instructions (Addendum)
Vaya a Emergencias hoy, para que le revisen you cabesa con rayos equis especiales hoy mismo.   [facility] Emergency Department 30 minutos de espera Address [phone]. [address] [place], [place] [address]  Emergency Department - [facility] [address] [place],  [place]  [address]   Si tus resultados son normales, entonses tienes que ver a un neurologo para que te traden con Licensed conveyancer diary para prevenier los dolores de [place].

## 2021-04-19 ENCOUNTER — Observation Stay (HOSPITAL_COMMUNITY): Payer: Self-pay

## 2021-04-19 ENCOUNTER — Observation Stay (HOSPITAL_BASED_OUTPATIENT_CLINIC_OR_DEPARTMENT_OTHER): Payer: Self-pay

## 2021-04-19 ENCOUNTER — Encounter (HOSPITAL_COMMUNITY): Payer: Self-pay | Admitting: Internal Medicine

## 2021-04-19 DIAGNOSIS — R519 Headache, unspecified: Secondary | ICD-10-CM | POA: Diagnosis present

## 2021-04-19 DIAGNOSIS — G44221 Chronic tension-type headache, intractable: Secondary | ICD-10-CM

## 2021-04-19 LAB — URINALYSIS, ROUTINE W REFLEX MICROSCOPIC
Bilirubin Urine: NEGATIVE
Glucose, UA: NEGATIVE mg/dL
Ketones, ur: 5 mg/dL — AB
Leukocytes,Ua: NEGATIVE
Nitrite: NEGATIVE
Protein, ur: NEGATIVE mg/dL
Specific Gravity, Urine: >1.046 — ABNORMAL HIGH (ref 1.005–1.030)
pH: 6 (ref 5.0–8.0)

## 2021-04-19 LAB — HIV ANTIBODY (ROUTINE TESTING W REFLEX): HIV Screen 4th Generation wRfx: NONREACTIVE

## 2021-04-19 LAB — RESP PANEL BY RT-PCR (FLU A&B, COVID) ARPGX2
Influenza A by PCR: NEGATIVE
Influenza B by PCR: NEGATIVE
SARS Coronavirus 2 by RT PCR: NEGATIVE

## 2021-04-19 LAB — C-REACTIVE PROTEIN: CRP: 0.9 mg/dL (ref <1.0)

## 2021-04-19 LAB — ECHOCARDIOGRAM COMPLETE
Area-P 1/2: 4.49 cm2
Height: 65 in
S' Lateral: 2.6 cm
Weight: 2720 oz

## 2021-04-19 LAB — RAPID URINE DRUG SCREEN, HOSP PERFORMED
Amphetamines: NOT DETECTED
Barbiturates: NOT DETECTED
Benzodiazepines: NOT DETECTED
Cocaine: NOT DETECTED
Opiates: NOT DETECTED
Tetrahydrocannabinol: NOT DETECTED

## 2021-04-19 LAB — LIPID PANEL
Cholesterol: 170 mg/dL (ref 0–200)
HDL: 28 mg/dL — ABNORMAL LOW (ref >40)
LDL Cholesterol: 128 mg/dL — ABNORMAL HIGH (ref 0–99)
Total CHOL/HDL Ratio: 6.1 RATIO
Triglycerides: 72 mg/dL (ref <150)
VLDL: 14 mg/dL (ref 0–40)

## 2021-04-19 LAB — HEMOGLOBIN A1C
Hgb A1c MFr Bld: 5.5 % (ref 4.8–5.6)
Mean Plasma Glucose: 111 mg/dL

## 2021-04-19 LAB — SEDIMENTATION RATE: Sed Rate: 5 mm/hr (ref 0–22)

## 2021-04-19 LAB — HCG, QUANTITATIVE, PREGNANCY: hCG, Beta Chain, Quant, S: 1 m[IU]/mL (ref <5)

## 2021-04-19 IMAGING — MR MR HEAD W/O CM
4 series · 48 of 48 positions shown · non-contrast
Comparison: CT head [DATE], CTA head and neck [DATE]

CLINICAL DATA: Paralyzing headaches for 3 months, evaluate for CVA

EXAM:
MRI HEAD WITHOUT CONTRAST
TECHNIQUE: Multiplanar, multiecho pulse sequences of the brain and surrounding
structures were obtained without intravenous contrast.

[Series 10: DWI · axial · 3.0mm · 1.36mm/px · z∈[-52,+99]mm · 26 of 104 slices shown (1 of 2)]
[im 1/104]
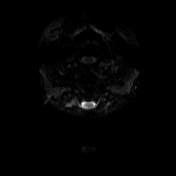
[im 5/104]
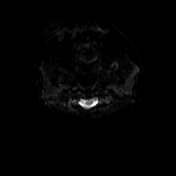
[im 9/104]
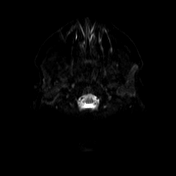
[im 13/104]
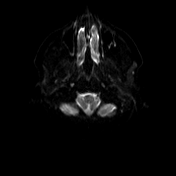
[im 17/104]
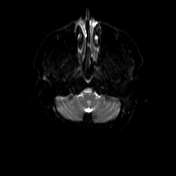
[im 21/104]
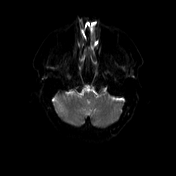
[im 25/104]
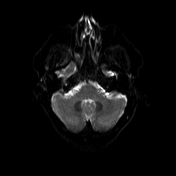
[im 29/104]
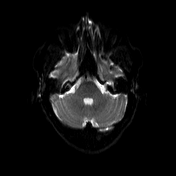
[im 33/104]
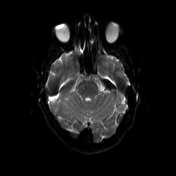
[im 38/104]
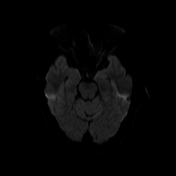
[im 42/104]
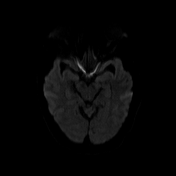
[im 46/104]
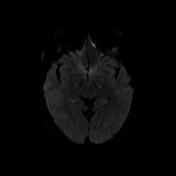
[im 50/104]
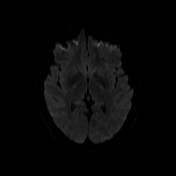
[im 54/104]
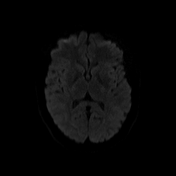
[im 58/104]
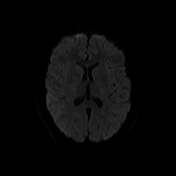
[im 62/104]
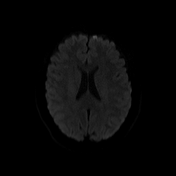
[im 66/104]
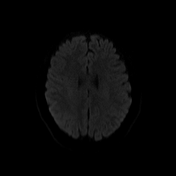
[im 71/104]
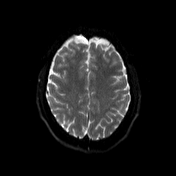
[im 75/104]
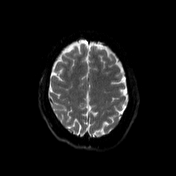
[im 79/104]
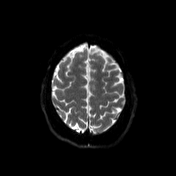
[im 83/104]
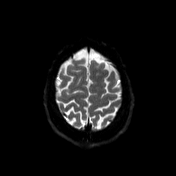
[im 87/104]
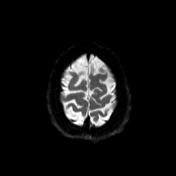
[im 91/104]
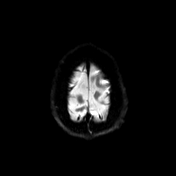
[im 95/104]
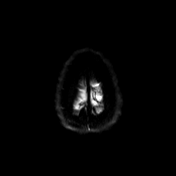
[im 99/104]
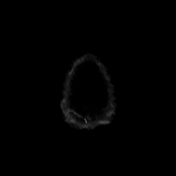
[im 104/104]
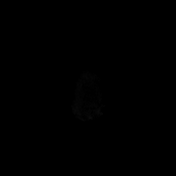

[Series 11: DWI · axial · 3.0mm · 1.36mm/px · z∈[-52,+99]mm · 12 of 52 slices shown (2 of 2)]
[im 1/52]
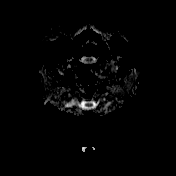
[im 5/52]
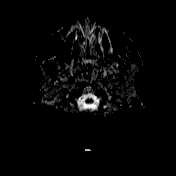
[im 10/52]
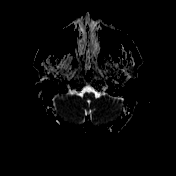
[im 14/52]
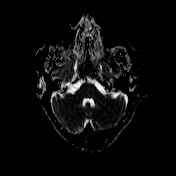
[im 19/52]
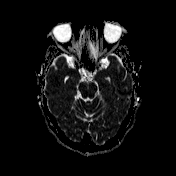
[im 24/52]
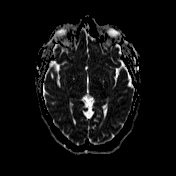
[im 28/52]
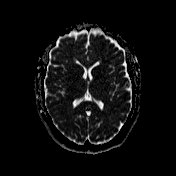
[im 33/52]
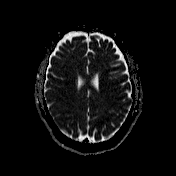
[im 38/52]
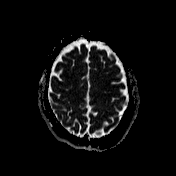
[im 42/52]
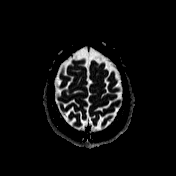
[im 47/52]
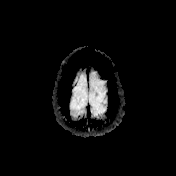
[im 52/52]
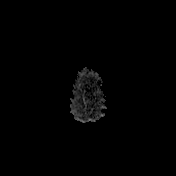

[Series 12: T1 · sagittal · 5.0mm · 0.75mm/px · 5 of 22 slices shown]
[im 1/22]
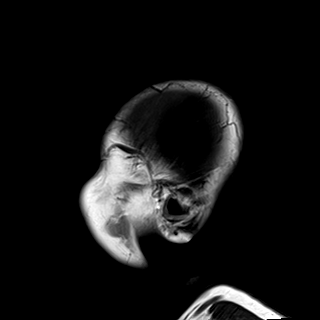
[im 6/22]
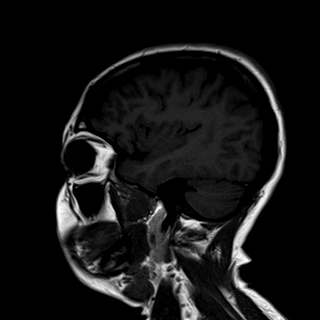
[im 11/22]
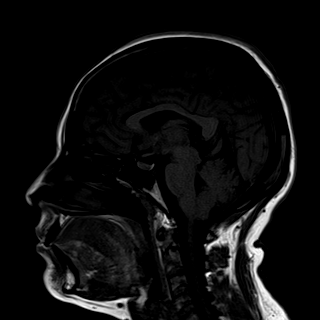
[im 16/22]
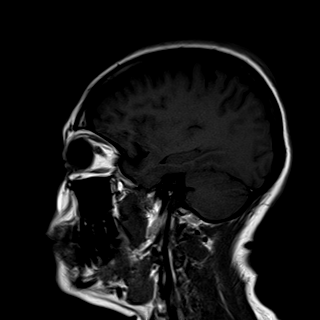
[im 22/22]
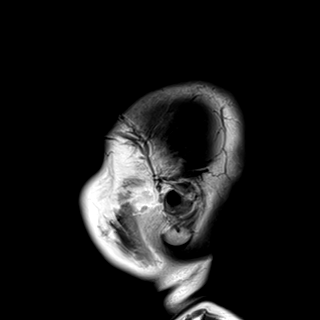

[Series 13: T2 · axial · 5.0mm · 0.62mm/px · z∈[-44,+98]mm · 5 of 23 slices shown]
[im 1/23]
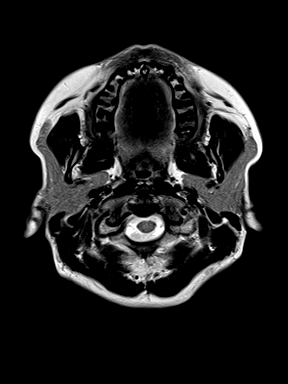
[im 6/23]
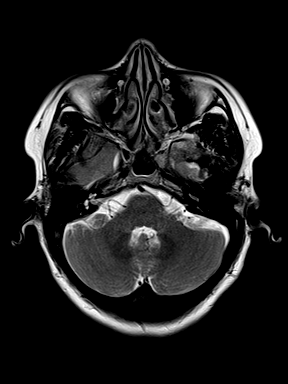
[im 12/23]
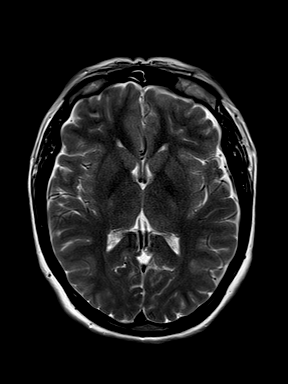
[im 17/23]
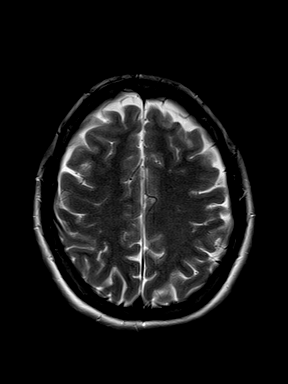
[im 23/23]
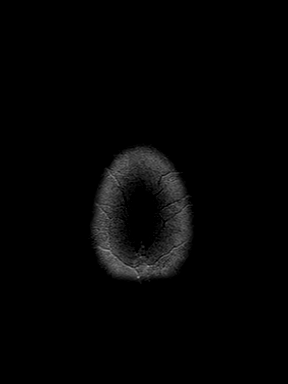

[48 of 48 positions shown; findings below may reference images not displayed]

FINDINGS: The patient could not tolerate completing the study. Axial DWI,
axial T2, and sagittal T1 weighted imaging were obtained.

Brain: There is no diffusion-weighted signal abnormality to suggest
acute infarct.

Parenchymal volume is normal. The ventricles are normal in size.
There is no parenchymal signal abnormality on the provided
sequences. There is no definite evidence of acute intracranial
hemorrhage or extra-axial fluid collection.

There is no mass lesion on the provided sequences. There is no
midline shift.

Vascular: Normal flow voids.

Skull and upper cervical spine: Normal marrow signal.

Sinuses/Orbits: There is mild mucosal thickening in the paranasal
sinuses. The globes and orbits are unremarkable.

Other: None.
IMPRESSION: 1. The patient could not tolerate completing the study. Axial
diffusion-weighted imaging, axial T2, and sagittal T1 images were
obtained.
2. No evidence of acute infarct on the diffusion-weighted images.
3. No other evidence of acute intracranial pathology on the provided
sequences.

## 2021-04-19 IMAGING — MR MR [PERSON_NAME] HEAD
4 series · 19 of 48 positions shown · IV contrast (7.5 M GAD)
Comparison: None

CLINICAL DATA: Headache, papilledema

EXAM:
MR VENOGRAM HEAD WITHOUT AND WITH CONTRAST
TECHNIQUE: Angiographic images of the intracranial venous structures were
acquired using MRV technique with intravenous contrast.
CONTRAST:  7.5mL GADAVIST GADOBUTROL 1 MMOL/ML IV SOLN

[Series 2: MRV · coronal · 1.5mm · 0.43mm/px · 6 of 117 slices shown]
[im 1/117]
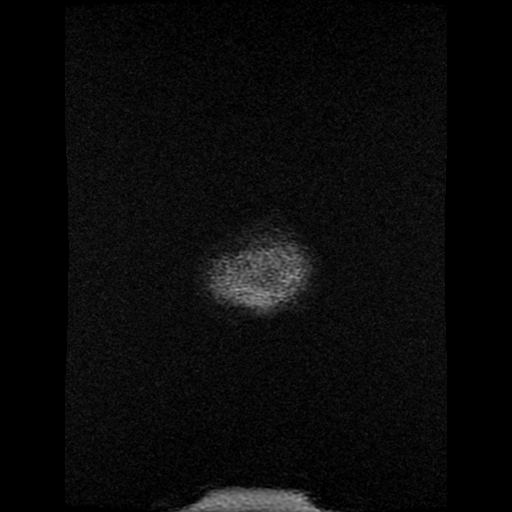
[im 24/117]
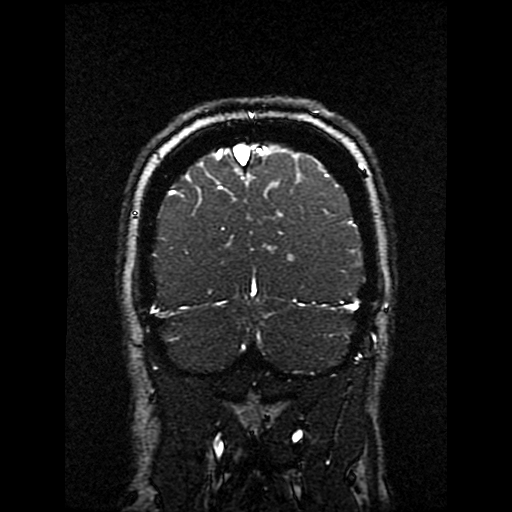
[im 47/117]
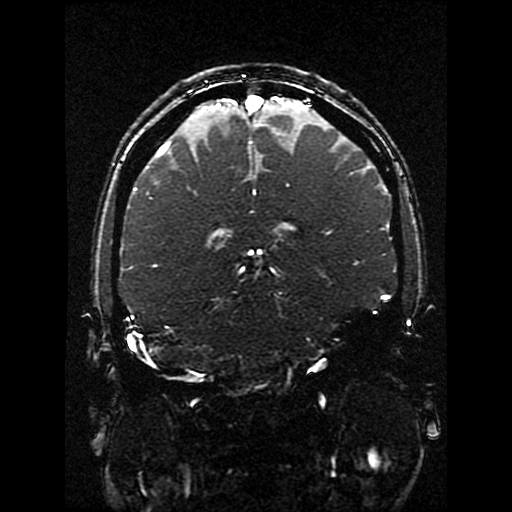
[im 70/117]
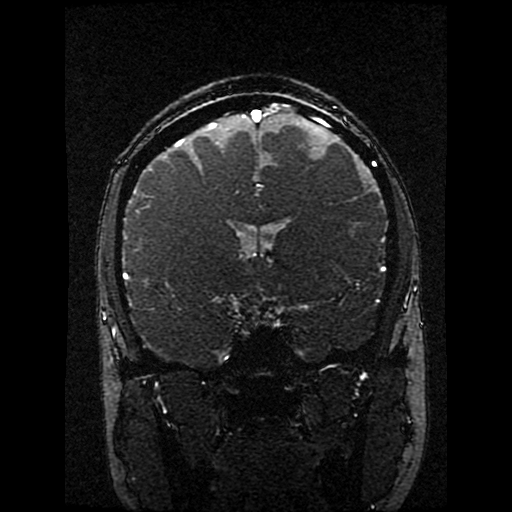
[im 93/117]
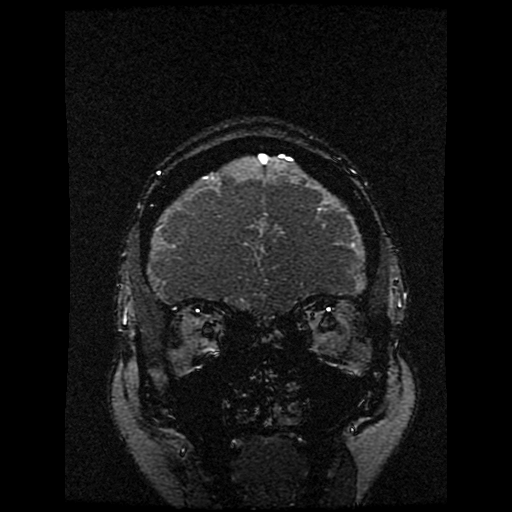
[im 117/117]
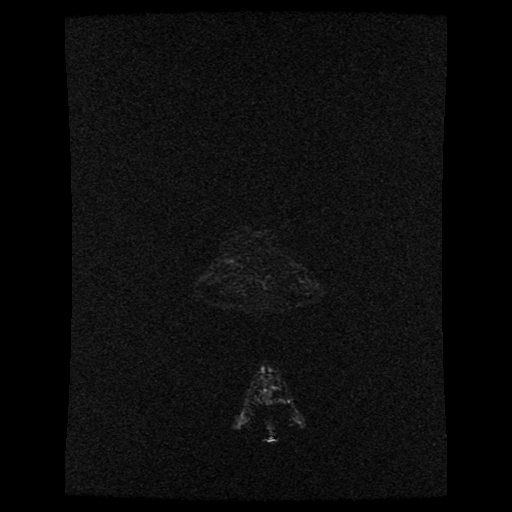

[Series 3: sag inhance (id) · sagittal · 1.8mm · 0.47mm/px · 7 of 339 slices shown]
[im 20/339]
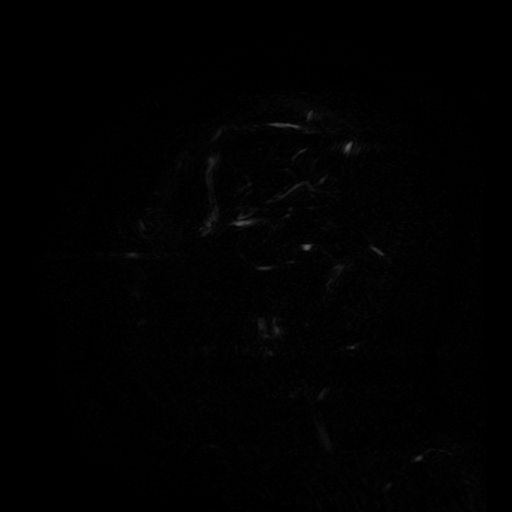
[im 60/339]
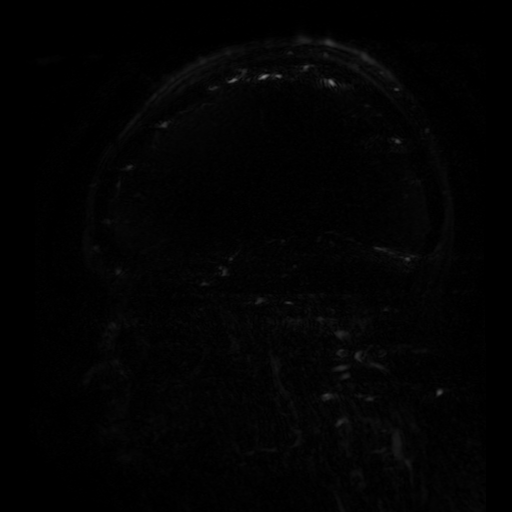
[im 100/339]
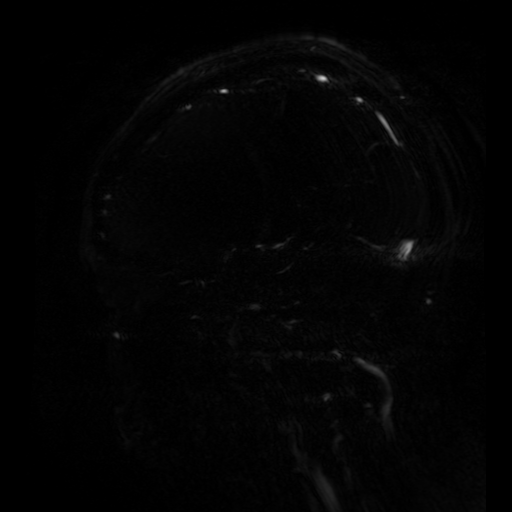
[im 140/339]
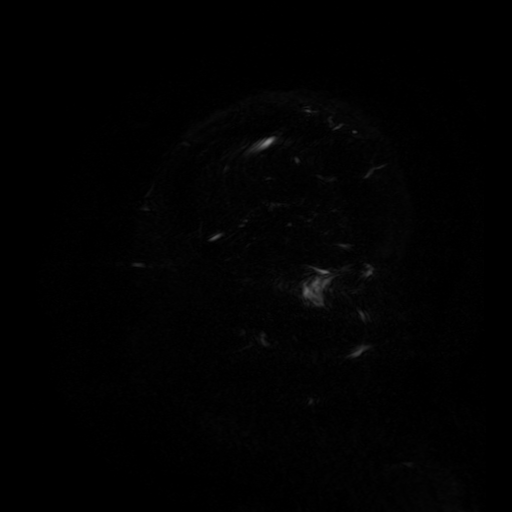
[im 179/339]
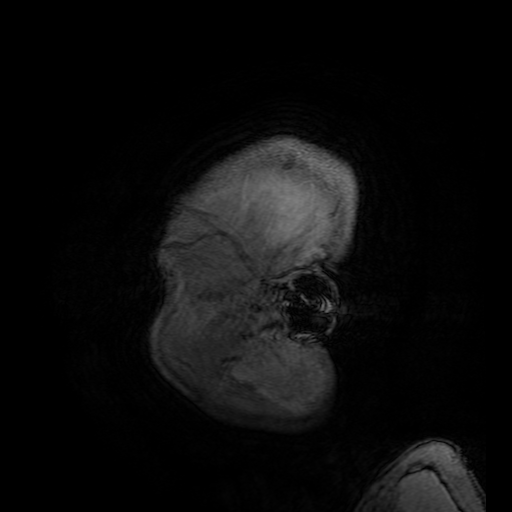
[im 199/339]
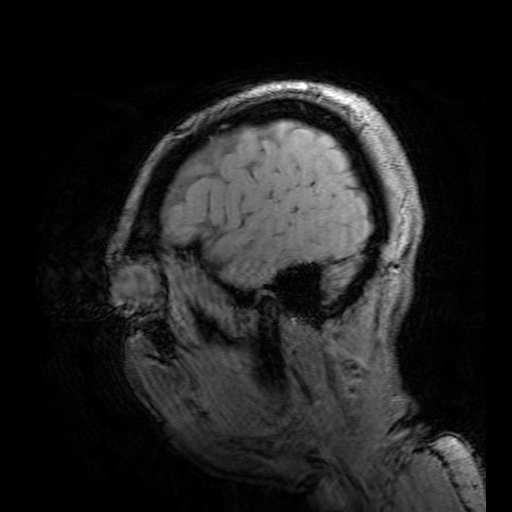
[im 299/339]
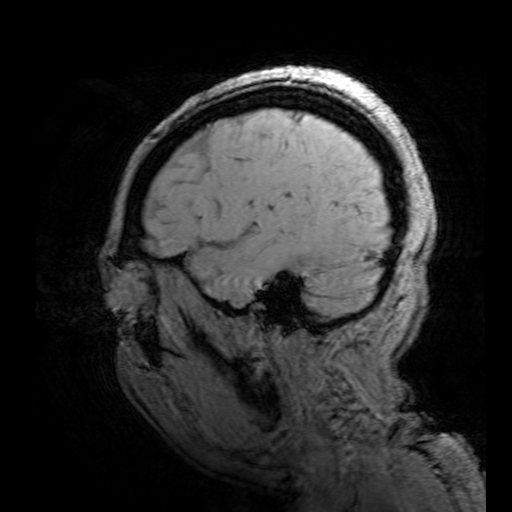

[Series 400: multiplanar reconstruction (mpr) · sagittal · 0.9mm · 0.47mm/px · 3 of 205 slices shown (1 of 2)]
[im 21/205]
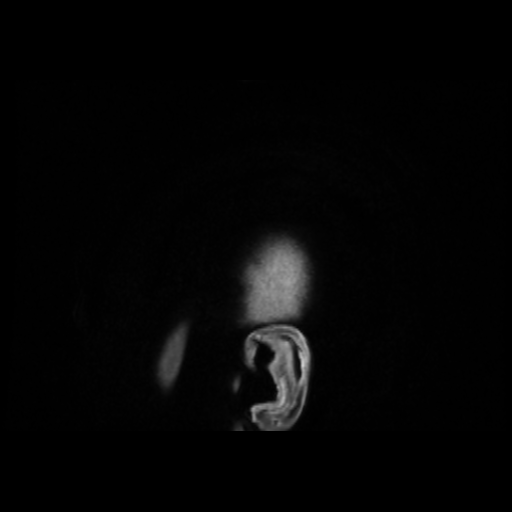
[im 103/205]
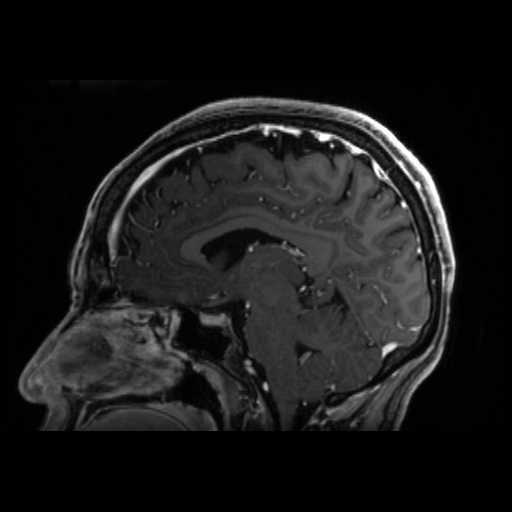
[im 184/205]
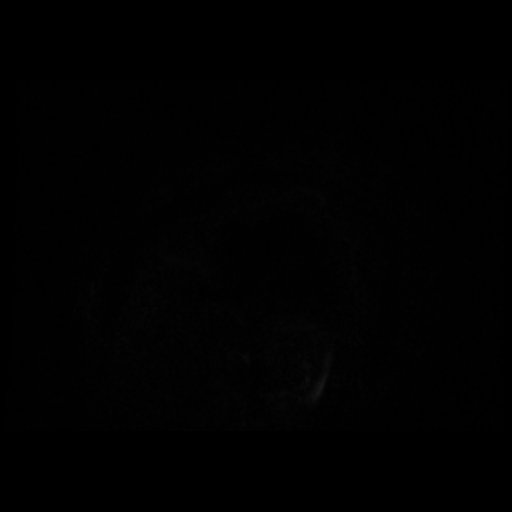

[Series 401: multiplanar reconstruction (mpr) · coronal · 0.9mm · 0.47mm/px · 3 of 255 slices shown (2 of 2)]
[im 43/255]
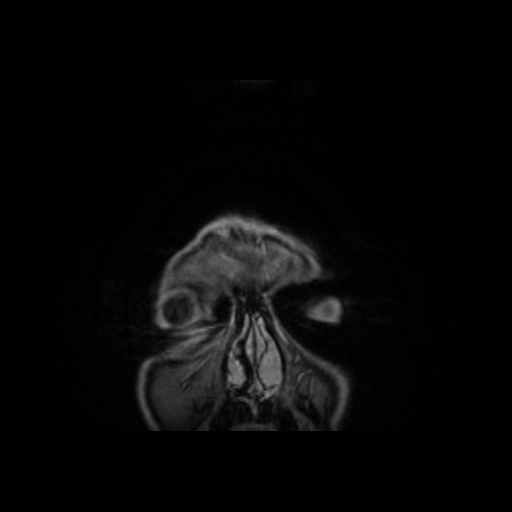
[im 128/255]
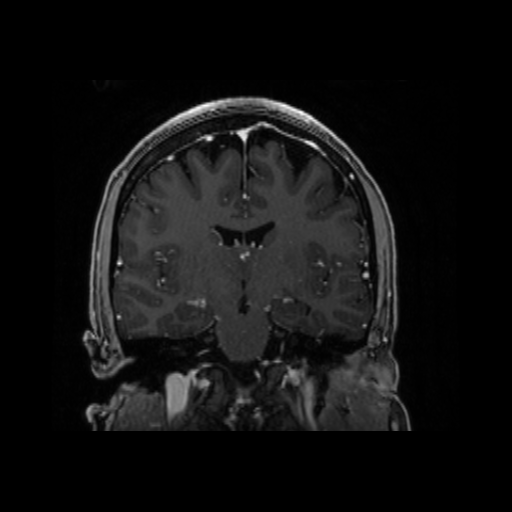
[im 212/255]
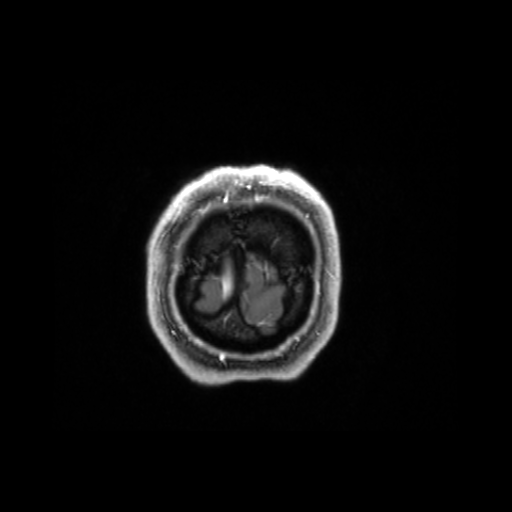

[19 of 48 positions shown; findings below may reference images not displayed]

FINDINGS: Superior sagittal sinus, straight sinus, vein of SARGUN, and internal
cerebral veins are patent. Transverse and sigmoid sinuses are
patent. Bilateral distal transverse sinus stenosis.

There is no abnormal intracranial enhancement.  Sella is not empty.
IMPRESSION: No evidence of venous thrombosis.

Stenosis of the distal transverse sinuses bilaterally, a nonspecific
finding that can be seen in the setting of idiopathic intracranial
hypertension. Notably, there is no empty sella present to
corroborate this.

## 2021-04-19 IMAGING — CT CT ANGIO HEAD-NECK (W OR W/O PERF)
2 of 7 series · 8 of 33 positions shown · IV contrast (OMNIPAQUE 350)
Comparison: Head CT from yesterday

CLINICAL DATA: Headache for 2 months with intermittent vision
blurriness.

EXAM:
CT ANGIOGRAPHY HEAD AND NECK
TECHNIQUE: Multidetector CT imaging of the head and neck was performed using
the standard protocol during bolus administration of intravenous
contrast. Multiplanar CT image reconstructions and MIPs were
obtained to evaluate the vascular anatomy. Carotid stenosis
measurements (when applicable) are obtained utilizing NASCET
criteria, using the distal internal carotid diameter as the
denominator.

[Series 5: cta head neck · axial · 0.43mm/px · z∈[+1272,+1378]mm · 2 of 160 slices shown]
[im 54/160  soft-tissue]
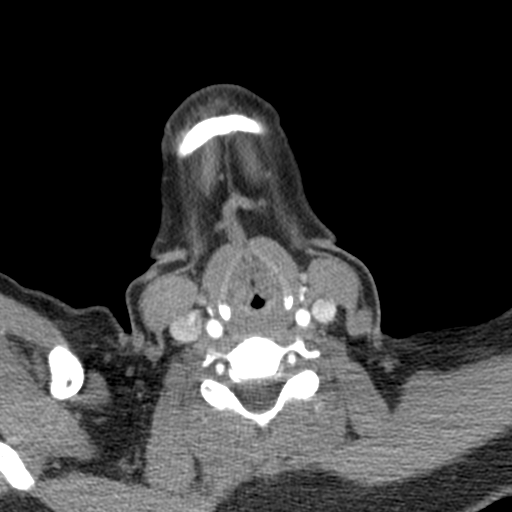
[im 107/160  soft-tissue]
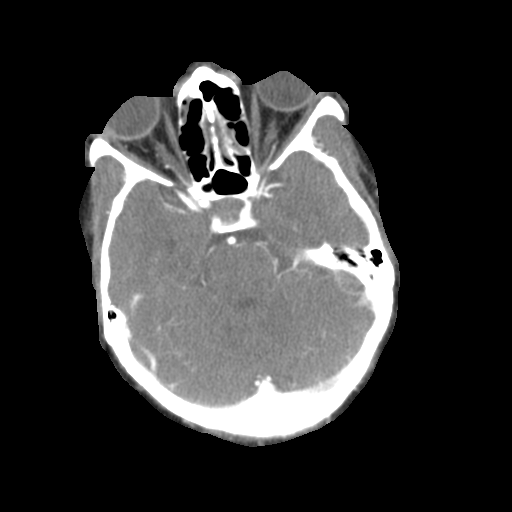

[Series 7: ax thin · axial · 0.39mm/px · z∈[+1212,+1438]mm · 6 of 317 slices shown]
[im 46/317  soft-tissue]
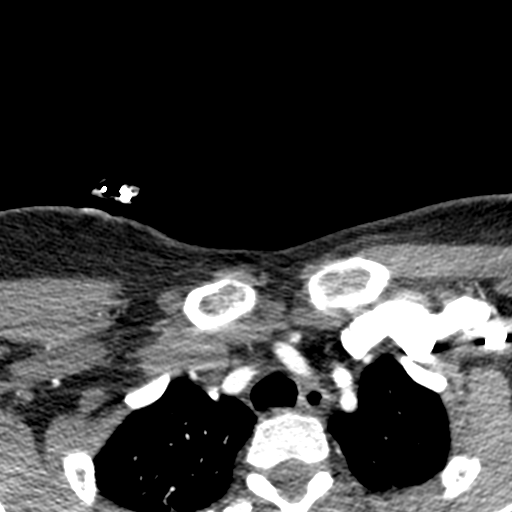
[im 91/317  bone]
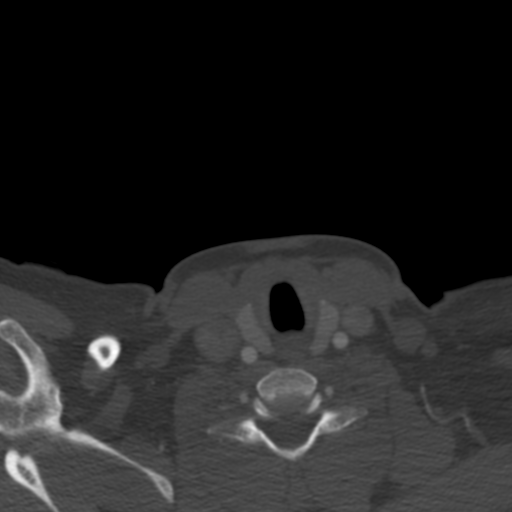
[im 136/317  soft-tissue]
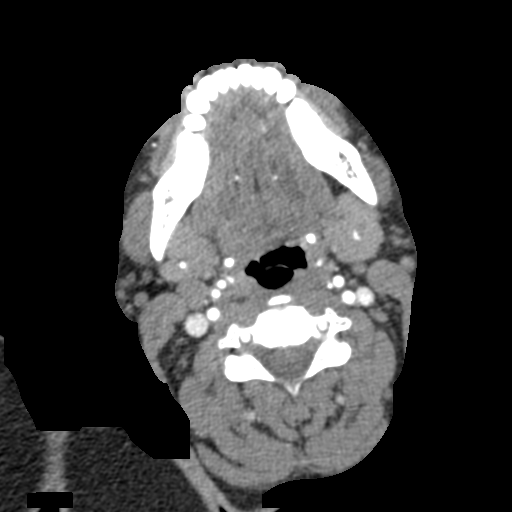
[im 181/317  bone]
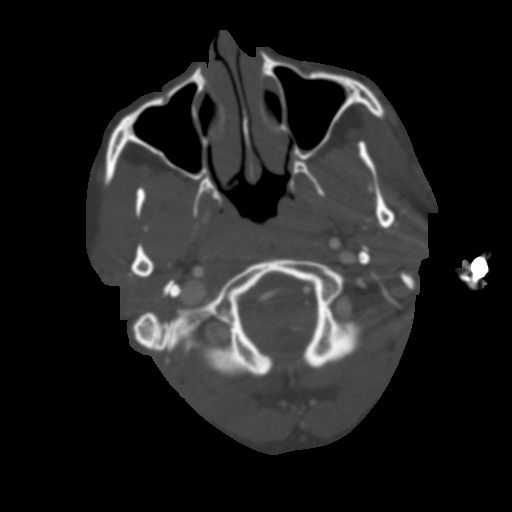
[im 226/317  soft-tissue]
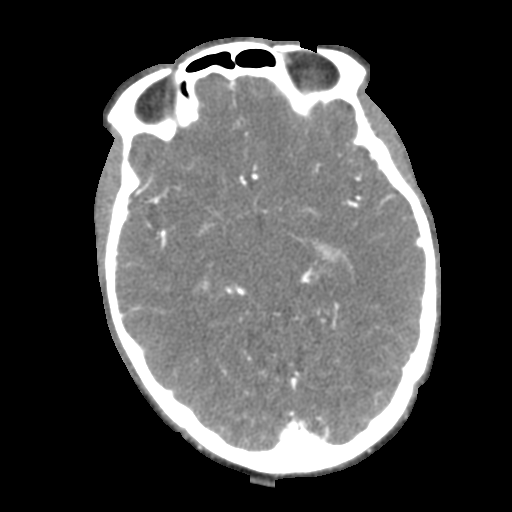
[im 271/317  bone]
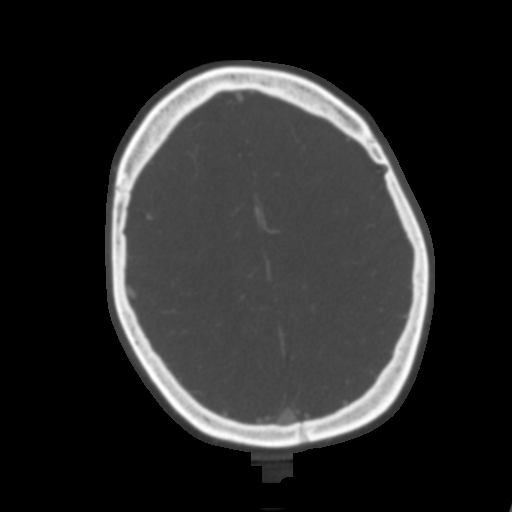

[8 of 33 positions shown; findings below may reference images not displayed]

RADIATION DOSE REDUCTION: This exam was performed according to the
departmental dose-optimization program which includes automated
exposure control, adjustment of the mA and/or kV according to
patient size and/or use of iterative reconstruction technique.

CONTRAST:  80mL OMNIPAQUE IOHEXOL 350 MG/ML SOLN
FINDINGS: CTA NECK FINDINGS

Aortic arch: Normal with 3 vessel branching.

Right carotid system: Vessels are smooth and widely patent. No
atheromatous changes.

Left carotid system: Vessels are smooth and widely patent. No
atheromatous changes.

Vertebral arteries: Vertebral and subclavian arteries are smoothly
contoured and widely patent. No atheromatous changes or
developmental asymmetry.

Skeleton: Unremarkable

Other neck: No acute finding. 8 mm nodule in the right lobe thyroid.
No followup recommended (ref: [HOSPITAL]. [DATE]):
143-50). Mucosal thickening in the paranasal sinuses, especially
ethmoids.

Upper chest: Negative

Review of the MIP images confirms the above findings

CTA HEAD FINDINGS

Anterior circulation: Vessels are smooth and widely patent. Negative
for aneurysm or vascular malformation.

Posterior circulation: Tiny proximal basilar fenestration. Vessels
are smooth and widely patent. Negative for aneurysm.

Venous sinuses: Unremarkable

Anatomic variants: Fetal type contribution to right PCA flow.

Review of the MIP images confirms the above findings
IMPRESSION: Negative CTA of the head and neck.

Mucosal thickening in the ethmoid sinuses.

## 2021-04-19 MED ORDER — METOCLOPRAMIDE HCL 5 MG/ML IJ SOLN
10.0000 mg | Freq: Once | INTRAMUSCULAR | Status: AC
  Administered 2021-04-19: 02:00:00 10 mg via INTRAVENOUS
  Filled 2021-04-19: qty 2

## 2021-04-19 MED ORDER — ASPIRIN 325 MG PO TABS: 325.0000 mg | ORAL_TABLET | Freq: Every day | ORAL | Status: DC

## 2021-04-19 MED ORDER — ACETAMINOPHEN 325 MG PO TABS
650.0000 mg | ORAL_TABLET | ORAL | Status: DC | PRN
  Administered 2021-04-19 – 2021-04-22 (×6): 650 mg via ORAL
  Filled 2021-04-19 (×6): qty 2

## 2021-04-19 MED ORDER — ASPIRIN 325 MG PO TABS
325.0000 mg | ORAL_TABLET | Freq: Every day | ORAL | Status: DC
  Administered 2021-04-19 – 2021-04-22 (×3): 325 mg via ORAL
  Filled 2021-04-19 (×3): qty 1

## 2021-04-19 MED ORDER — SODIUM CHLORIDE 0.9 % IV SOLN: INTRAVENOUS | Status: AC

## 2021-04-19 MED ORDER — KETOROLAC TROMETHAMINE 30 MG/ML IJ SOLN
30.0000 mg | Freq: Once | INTRAMUSCULAR | Status: AC
  Administered 2021-04-19: 02:00:00 30 mg via INTRAVENOUS
  Filled 2021-04-19: qty 1

## 2021-04-19 MED ORDER — DIPHENHYDRAMINE HCL 50 MG/ML IJ SOLN
25.0000 mg | Freq: Once | INTRAMUSCULAR | Status: AC
  Administered 2021-04-19: 02:00:00 25 mg via INTRAVENOUS
  Filled 2021-04-19: qty 1

## 2021-04-19 MED ORDER — SODIUM CHLORIDE (PF) 0.9 % IJ SOLN
INTRAMUSCULAR | Status: AC
  Filled 2021-04-19: qty 50

## 2021-04-19 MED ORDER — GADOBUTROL 1 MMOL/ML IV SOLN
7.5000 mL | Freq: Once | INTRAVENOUS | Status: AC | PRN
  Administered 2021-04-19: 17:00:00 7.5 mL via INTRAVENOUS

## 2021-04-19 MED ORDER — IOHEXOL 350 MG/ML SOLN
80.0000 mL | Freq: Once | INTRAVENOUS | Status: AC | PRN
  Administered 2021-04-19: 07:00:00 80 mL via INTRAVENOUS

## 2021-04-19 MED ORDER — MAGNESIUM SULFATE 2 GM/50ML IV SOLN
2.0000 g | Freq: Once | INTRAVENOUS | Status: AC
  Administered 2021-04-19: 05:00:00 2 g via INTRAVENOUS
  Filled 2021-04-19: qty 50

## 2021-04-19 MED ORDER — LACTATED RINGERS IV BOLUS
1000.0000 mL | Freq: Once | INTRAVENOUS | Status: AC
Start: 2021-04-19 — End: 2021-04-19
  Administered 2021-04-19: 05:00:00 1000 mL via INTRAVENOUS

## 2021-04-19 MED ORDER — ACETAMINOPHEN 160 MG/5ML PO SOLN
650.0000 mg | ORAL | Status: DC | PRN
  Administered 2021-04-20: 17:00:00 650 mg
  Filled 2021-04-19: qty 20.3

## 2021-04-19 MED ORDER — TRAMADOL HCL 50 MG PO TABS
50.0000 mg | ORAL_TABLET | Freq: Four times a day (QID) | ORAL | Status: DC | PRN
  Administered 2021-04-19 – 2021-04-20 (×3): 100 mg via ORAL
  Filled 2021-04-19 (×4): qty 2
  Filled 2021-04-19: qty 1

## 2021-04-19 MED ORDER — STROKE: EARLY STAGES OF RECOVERY BOOK
Freq: Once | Status: DC
  Filled 2021-04-19: qty 1

## 2021-04-19 MED ORDER — VALPROATE SODIUM 100 MG/ML IV SOLN
500.0000 mg | Freq: Once | INTRAVENOUS | Status: AC
  Administered 2021-04-19: 08:00:00 500 mg via INTRAVENOUS
  Filled 2021-04-19: qty 5

## 2021-04-19 MED ORDER — ASPIRIN 300 MG RE SUPP: 300.0000 mg | Freq: Every day | RECTAL | Status: DC

## 2021-04-19 MED ORDER — ONDANSETRON HCL 4 MG/2ML IJ SOLN
4.0000 mg | Freq: Once | INTRAMUSCULAR | Status: AC | PRN
  Administered 2021-04-20: 01:00:00 4 mg via INTRAVENOUS
  Filled 2021-04-19: qty 2

## 2021-04-19 MED ORDER — IBUPROFEN 200 MG PO TABS
400.0000 mg | ORAL_TABLET | Freq: Once | ORAL | Status: AC | PRN
  Administered 2021-04-19: 22:00:00 400 mg via ORAL
  Filled 2021-04-19: qty 2

## 2021-04-19 MED ORDER — ACETAMINOPHEN 650 MG RE SUPP: 650.0000 mg | RECTAL | Status: DC | PRN

## 2021-04-19 NOTE — ED Notes (Signed)
Visual acuity R-20/200  L-40/400 Both 20/100

## 2021-04-19 NOTE — ED Notes (Signed)
[facility] paged for transport at this time.  °

## 2021-04-19 NOTE — ED Notes (Signed)
[facility] en route.  °

## 2021-04-19 NOTE — ED Notes (Signed)
Patient transported to CT 

## 2021-04-19 NOTE — ED Notes (Signed)
MRI at 1340.

## 2021-04-19 NOTE — ED Notes (Signed)
Lunch provided.

## 2021-04-19 NOTE — ED Notes (Signed)
Pt given breakfast tray by OT at this time.

## 2021-04-19 NOTE — ED Provider Notes (Signed)
[facility]-EMERGENCY DEPT Provider Note   CSN: [id number] Arrival date & time: 04/18/21  1846     History  Chief Complaint  Patient presents with   Headache    Angela Rangel is a 36 y.o. female.  The history is provided by the patient. The history is limited by a language barrier. A language interpreter was used (spanish - [phone]).  Headache Pain location:  Generalized Duration: 2 months. Timing:  Constant Progression:  Worsening Chronicity:  New Associated symptoms: dizziness, nausea and vomiting   Associated symptoms: no back pain, no fever, no hearing loss and no seizures   Risk factors: no family hx of SAH   She reports it got worse 3 weeks ago improved, then she reports it also got worse over 2 days ago.  She was at rest when it started.  She reports it has been similar to this severity of pain in the past.   No falls/head injury No travel  Denies current vision loss   Past Medical History:  Diagnosis Date   Medical history non-contributory    No daily meds/contraception Home Medications Prior to Admission medications   Not on File      Allergies    Latex    Review of Systems   Review of Systems  Constitutional:  Negative for fever.  HENT:  Negative for hearing loss.   Eyes:        Reports occasional blurred vision  Respiratory:  Negative for shortness of breath.   Cardiovascular:  Negative for chest pain.  Gastrointestinal:  Positive for nausea and vomiting.       Vomited over 24 hrs ago   Musculoskeletal:  Negative for back pain.       Neck pain with HA  Neurological:  Positive for dizziness and headaches. Negative for seizures, syncope and facial asymmetry.       Reports weakness in legs  All other systems reviewed and are negative.  Physical Exam Updated Vital Signs BP 103/74    Pulse (!) 59    Temp 98 F (36.7 C) (Oral)    Resp 16    Ht 1.651 m (5\' 5" )    Wt 77.1 kg    LMP 04/18/2021    SpO2 98%    BMI 28.29  kg/m  Physical Exam CONSTITUTIONAL: Well developed/well nourished HEAD: Normocephalic/atraumatic EYES: EOMI/PERRL, no nystagmus, no ptosis, normal fundoscopic exam (no papilledema)  ENMT: Mucous membranes moist NECK: supple no meningeal signs, no bruits SPINE/BACK:entire spine nontender CV: S1/S2 noted, no murmurs/rubs/gallops noted LUNGS: Lungs are clear to auscultation bilaterally, no apparent distress ABDOMEN: soft, nontender, no rebound or guarding GU:no cva tenderness NEURO:Awake/alert, face symmetric, no arm or leg drift is noted Equal 5/5 strength with shoulder abduction, elbow flex/extension, wrist flex/extension in upper extremities and equal hand grips bilaterally Equal 5/5 strength with hip flexion,knee flex/extension, foot dorsi/plantar flexion Cranial nerves 3/4/5/6/10/02/08/11/12 tested and intact Gait normal without ataxia No past pointing Sensation to light touch intact in all extremities EXTREMITIES: pulses normal, full ROM SKIN: warm, color normal PSYCH: no abnormalities of mood noted, alert and oriented to situation  ED Results / Procedures / Treatments   Labs (all labs ordered are listed, but only abnormal results are displayed) Labs Reviewed  CBC - Abnormal; Notable for the following components:      Result Value   WBC 10.8 (*)    All other components within normal limits  BASIC METABOLIC PANEL - Abnormal; Notable for the following  components:   Glucose, Bld 104 (*)    All other components within normal limits  RESP PANEL BY RT-PCR (FLU A&B, COVID) ARPGX2  HCG, QUANTITATIVE, PREGNANCY    EKG None  Radiology CT Head Wo Contrast  Result Date: 04/18/2021 CLINICAL DATA:  Headache, sudden, severe Dizziness and vomiting. EXAM: CT HEAD WITHOUT CONTRAST TECHNIQUE: Contiguous axial images were obtained from the base of the skull through the vertex without intravenous contrast. RADIATION DOSE REDUCTION: This exam was performed according to the departmental  dose-optimization program which includes automated exposure control, adjustment of the mA and/or kV according to patient size and/or use of iterative reconstruction technique. COMPARISON:  Head CT 06/22/2014 FINDINGS: Brain: No intracranial hemorrhage, mass effect, or midline shift. No hydrocephalus. The basilar cisterns are patent. No evidence of territorial infarct or acute ischemia. No extra-axial or intracranial fluid collection. Vascular: No hyperdense vessel or unexpected calcification. Skull: No fracture or focal lesion. Sinuses/Orbits: Occasional mucosal thickening of included ethmoid air cells. No acute orbital findings. Other: None. IMPRESSION: No acute intracranial abnormality. Electronically Signed   By: Narda Rutherford M.D.   On: 04/18/2021 21:48    Procedures Procedures    Medications Ordered in ED Medications  magnesium sulfate IVPB 2 g 50 mL (has no administration in time range)  lactated ringers bolus 1,000 mL (has no administration in time range)  metoCLOPramide (REGLAN) injection 10 mg (10 mg Intravenous Given 04/19/21 0133)  diphenhydrAMINE (BENADRYL) injection 25 mg (25 mg Intravenous Given 04/19/21 0133)  ketorolac (TORADOL) 30 MG/ML injection 30 mg (30 mg Intravenous Given 04/19/21 0133)    ED Course/ Medical Decision Making/ A&P Clinical Course as of 04/19/21 0507  Tue Apr 19, 2021  0254 Patient with very difficult history.  I utilized interpreter on my exam.  When I compared my notes with urgent care and provider in triage, there was some discrepancies.  Patient has no focal neurodeficits and is ambulatory.  However visual acuity is altered.  I have consulted teleneurology.  Anticipate further imaging [DW]  [phone] Seen by teleneurology.  On their exam, patient had blurred vision as well as sensory deficit which was not seen on my exam They recommend admission, MRI brain with and without contrast, MRV head without contrast  [DW]    Clinical Course User Index [DW] [name], MD                           Medical Decision Making Amount and/or Complexity of Data Reviewed Radiology: ordered.  Risk Prescription drug management. Decision regarding hospitalization.   This patient presents to the ED for concern of headache, this involves an extensive number of treatment options, and is a complaint that carries with it a high risk of complications and morbidity.  The differential diagnosis includes stroke, brain tumor, intracranial hemorrhage, subarachnoid hemorrhage, idiopathic intracranial hypertension, CVST, meningitis   Social Determinants of Health: Patients English as  a second language increases the complexity of managing their presentation  Additional history obtained:  Records reviewed urgent care notes  Lab Tests: I Ordered, and personally interpreted labs.  The pertinent results include: Mild leukocytosis  Imaging Studies ordered: I ordered imaging studies including CT scan head I independently visualized and interpreted imaging which showed no acute findings I agree with the radiologist interpretation   Medicines ordered and prescription drug management: I ordered medication including Reglan and Benadryl for headache Reevaluation of the patient after these medicines showed that the patient  stayed the same  Critical Interventions:       Neurology consultation  Consultations Obtained: I requested consultation with the consultant neurologist, and discussed  findings as well as pertinent plan - they recommend: MRI brain with and without contrast, MRV without contrast and admission and aspirin  Reevaluation: After the interventions noted above, I reevaluated the patient and found that they have :stayed the same  Complexity of problems addressed: Patients presentation is most consistent with  acute presentation with potential threat to life or bodily function      Disposition: After consideration of the diagnostic results and  the patients response to treatment,  I feel that the patent would benefit from admission .   Discussed with Dr. Toniann FailKakrakandy for admission         Final Clinical Impression(s) / ED Diagnoses Final diagnoses:  Intractable headache, unspecified chronicity pattern, unspecified headache type    Rx / DC Orders ED Discharge Orders          Ordered    Ambulatory referral to Neurology       Comments: An appointment is requested in approximately: 1 week   04/19/21 0122              Zadie RhineWickline, Arath Kaigler, MD 04/19/21 [phone]

## 2021-04-19 NOTE — Progress Notes (Signed)
°  Echocardiogram 2D Echocardiogram has been performed.  Darlina Sicilian M 04/19/2021, 2:06 PM

## 2021-04-19 NOTE — H&P (Signed)
History and Physical    Angela Rangel [id number] DOB: Nov 14, 1985 DOA: 04/18/2021  PCP: Patient, No Pcp Per (Inactive)  Patient coming from: Home.  Chief Complaint: Headache.  Patent attorney used.  HPI: Angela Rangel is a 36 y.o. female with history of chronic headaches has been using ibuprofen over the last 1 week he has been experiencing severe headache mostly in the right side around the vertex pounding in nature severe with blurred vision but no weakness of the extremities.  Has been having nausea vomiting and a headache has been persistent.  Usually the headaches get better with ibuprofen but not at this time.  ED Course: In the ER CT head is unremarkable.  Patient was afebrile.  Patient was given migraine cocktail despite which patient's headache persisted and neurology was consulted.  Neurology recommended getting MRI of brain and MRV and CT angiogram work-up for stroke and vascular headache.  Patient admitted for further work-up.  COVID test negative.  Review of Systems: As per HPI, rest all negative.   Past Medical History:  Diagnosis Date   Medical history non-contributory     Past Surgical History:  Procedure Laterality Date   LAPAROSCOPIC APPENDECTOMY N/A 08/26/2019   Procedure: APPENDECTOMY LAPAROSCOPIC;  Surgeon: Johnathan Hausen, MD;  Location: [facility];  Service: General;  Laterality: N/A;   NO PAST SURGERIES       reports that she has never smoked. She has never used smokeless tobacco. She reports that she does not drink alcohol and does not use drugs.  Allergies  Allergen Reactions   Latex Itching    Itching and hives    Family History  Problem Relation Age of Onset   Diabetes type II Other     Prior to Admission medications   Not on File    Physical Exam: Constitutional: Moderately built and nourished. Vitals:   04/19/21 0345 04/19/21 0400 04/19/21 0500 04/19/21 0515  BP: 110/74 112/79 103/74 99/70  Pulse: 72 63 (!) 59 (!)  51  Resp:      Temp:    98 F (36.7 C)  TempSrc:    Oral  SpO2: 99% 98% 98% 97%  Weight:      Height:       Eyes: Anicteric no pallor. ENMT: No discharge from the ears eyes nose and mouth. Neck: No mass felt.  No neck rigidity. Respiratory: No rhonchi or crepitations. Cardiovascular: S1-S2 heard. Abdomen: Soft nontender bowel sound present. Musculoskeletal: No edema. Skin: No rash. Neurologic: Alert awake oriented to time place and person.  Moving all extremities 5 x 5.  No facial asymmetry tongue is midline pupils are equal and reacting to light. Psychiatric: Appears normal per normal affect.   Labs on Admission: I have personally reviewed following labs and imaging studies  CBC: Recent Labs  Lab 04/18/21 2013  WBC 10.8*  HGB 14.8  HCT 45.4  MCV 89.7  PLT 123456   Basic Metabolic Panel: Recent Labs  Lab 04/18/21 2013  NA 140  K 4.0  CL 102  CO2 27  GLUCOSE 104*  BUN 13  CREATININE 0.57  CALCIUM 9.5   GFR: Estimated Creatinine Clearance: 100.7 mL/min (by C-G formula based on SCr of 0.57 mg/dL). Liver Function Tests: No results for input(s): AST, ALT, ALKPHOS, BILITOT, PROT, ALBUMIN in the last 168 hours. No results for input(s): LIPASE, AMYLASE in the last 168 hours. No results for input(s): AMMONIA in the last 168 hours. Coagulation Profile: No results for input(s):  INR, PROTIME in the last 168 hours. Cardiac Enzymes: No results for input(s): CKTOTAL, CKMB, CKMBINDEX, TROPONINI in the last 168 hours. BNP (last 3 results) No results for input(s): PROBNP in the last 8760 hours. HbA1C: No results for input(s): HGBA1C in the last 72 hours. CBG: No results for input(s): GLUCAP in the last 168 hours. Lipid Profile: No results for input(s): CHOL, HDL, LDLCALC, TRIG, CHOLHDL, LDLDIRECT in the last 72 hours. Thyroid Function Tests: No results for input(s): TSH, T4TOTAL, FREET4, T3FREE, THYROIDAB in the last 72 hours. Anemia Panel: No results for input(s):  VITAMINB12, FOLATE, FERRITIN, TIBC, IRON, RETICCTPCT in the last 72 hours. Urine analysis:    Component Value Date/Time   COLORURINE YELLOW 08/25/2019 2125   APPEARANCEUR CLEAR 08/25/2019 2125   LABSPEC 1.020 08/25/2019 2125   PHURINE 8.5 (H) 08/25/2019 2125   GLUCOSEU NEGATIVE 08/25/2019 2125   HGBUR TRACE (A) 08/25/2019 2125   BILIRUBINUR NEGATIVE 08/25/2019 2125   BILIRUBINUR small 08/05/2014 1556   [place] 08/25/2019 2125   PROTEINUR NEGATIVE 08/25/2019 2125   UROBILINOGEN 0.2 12/11/2014 1603   NITRITE NEGATIVE 08/25/2019 2125   LEUKOCYTESUR NEGATIVE 08/25/2019 2125   Sepsis Labs: @LABRCNTIP (procalcitonin:4,lacticidven:4) ) Recent Results (from the past 240 hour(s))  Resp Panel by RT-PCR (Flu A&B, Covid) Nasopharyngeal Swab     Status: None   Collection Time: 04/19/21  4:18 AM   Specimen: Nasopharyngeal Swab; Nasopharyngeal(NP) swabs in vial transport medium  Result Value Ref Range Status   SARS Coronavirus 2 by RT PCR NEGATIVE NEGATIVE Final    Comment: (NOTE) SARS-CoV-2 target nucleic acids are NOT DETECTED.  The SARS-CoV-2 RNA is generally detectable in upper respiratory specimens during the acute phase of infection. The lowest concentration of SARS-CoV-2 viral copies this assay can detect is 138 copies/mL. A negative result does not preclude SARS-Cov-2 infection and should not be used as the sole basis for treatment or other patient management decisions. A negative result may occur with  improper specimen collection/handling, submission of specimen other than nasopharyngeal swab, presence of viral mutation(s) within the areas targeted by this assay, and inadequate number of viral copies(<138 copies/mL). A negative result must be combined with clinical observations, patient history, and epidemiological information. The expected result is Negative.  Fact Sheet for Patients:  EntrepreneurPulse.com.au  Fact Sheet for Healthcare Providers:   IncredibleEmployment.be  This test is no t yet approved or cleared by the [place] FDA and  has been authorized for detection and/or diagnosis of SARS-CoV-2 by FDA under an Emergency Use Authorization (EUA). This EUA will remain  in effect (meaning this test can be used) for the duration of the COVID-19 declaration under Section 564(b)(1) of the Act, 21 U.S.C.section 360bbb-3(b)(1), unless the authorization is terminated  or revoked sooner.       Influenza A by PCR NEGATIVE NEGATIVE Final   Influenza B by PCR NEGATIVE NEGATIVE Final    Comment: (NOTE) The Xpert Xpress SARS-CoV-2/FLU/RSV plus assay is intended as an aid in the diagnosis of influenza from Nasopharyngeal swab specimens and should not be used as a sole basis for treatment. Nasal washings and aspirates are unacceptable for Xpert Xpress SARS-CoV-2/FLU/RSV testing.  Fact Sheet for Patients: EntrepreneurPulse.com.au  Fact Sheet for Healthcare Providers: IncredibleEmployment.be  This test is not yet approved or cleared by the [place] FDA and has been authorized for detection and/or diagnosis of SARS-CoV-2 by FDA under an Emergency Use Authorization (EUA). This EUA will remain in effect (meaning this test can be used) for the  duration of the COVID-19 declaration under Section 564(b)(1) of the Act, 21 U.S.C. section 360bbb-3(b)(1), unless the authorization is terminated or revoked.  Performed at [facility], [place] [address] [place], [place] [address]      Radiological Exams on Admission: CT Head Wo Contrast  Result Date: 04/18/2021 CLINICAL DATA:  Headache, sudden, severe Dizziness and vomiting. EXAM: CT HEAD WITHOUT CONTRAST TECHNIQUE: Contiguous axial images were obtained from the base of the skull through the vertex without intravenous contrast. RADIATION DOSE REDUCTION: This exam was performed according to the departmental  dose-optimization program which includes automated exposure control, adjustment of the mA and/or kV according to patient size and/or use of iterative reconstruction technique. COMPARISON:  Head CT 06/22/2014 FINDINGS: Brain: No intracranial hemorrhage, mass effect, or midline shift. No hydrocephalus. The basilar cisterns are patent. No evidence of territorial infarct or acute ischemia. No extra-axial or intracranial fluid collection. Vascular: No hyperdense vessel or unexpected calcification. Skull: No fracture or focal lesion. Sinuses/Orbits: Occasional mucosal thickening of included ethmoid air cells. No acute orbital findings. Other: None. IMPRESSION: No acute intracranial abnormality. Electronically Signed   By: Keith Rake M.D.   On: 04/18/2021 21:48      Assessment/Plan Principal Problem:   Headache    Intractable headache -appreciate neurology consult.  Neurology at this time is requesting work-up for possible stroke and vascular headache which includes MRV of the brain MRI brain with and without contrast CT angiogram head and neck sed rate CRP 2D echo and patient has been placed on Depakote 5 mg IV.  Aspirin.  Further work-up based on the test ordered.  Patient did pass stroke swallow.  We will keep patient on neurochecks.   DVT prophylaxis: SCDs for now.  Until make sure patient does not require lumbar puncture. Code Status: Full code. Family Communication: Discussed with patient. Disposition Plan: Home. Consults called: Neurology. Admission status: Observation.   Rise Patience MD [facility] Pager [phone].  If 7PM-7AM, please contact night-coverage [email or web address] Password [facility]  04/19/2021, 6:12 AM

## 2021-04-19 NOTE — ED Notes (Signed)
OT at the bedside

## 2021-04-19 NOTE — ED Notes (Signed)
[facility] here to take pt.

## 2021-04-19 NOTE — ED Notes (Signed)
Echo at the bedside at this time.  

## 2021-04-19 NOTE — ED Notes (Signed)
Pt attached to cardiac monitor x3. VSS. A&O x4. Orientation questions and modified NIHSS obtained via spanish interpreter translating.

## 2021-04-19 NOTE — ED Notes (Signed)
Patient ambulated to restroom with assistance. Stated she felt a little dizzy

## 2021-04-19 NOTE — Consult Note (Addendum)
[facility] TeleNeurology Consult Services  Stat Consult  Patient Name:   Angela Rangel, Angela Rangel Date of Birth:   [birth year] Identification Number:   MRN - [id number] Date of Service:   04/19/2021 02:21:22  Diagnosis:       G44.1 - Vascular headache, not elsewhere classified  Impression 32F presents with severe headaches for 2 weeks and on exam had right numbness and b/l R>L vision changes. Got migraine cocktail in ED (1:30 am) without much improvement and headache still an 8/10. Rec MRI brain wwo and MRV head wo and admisison for headache management and CVA workup. Would start ASA 325 mg for now. HCT unremarkable.  CT HEAD: Reviewed  Our recommendations are outlined below.  Diagnostic Studies: MRI of brain with and without contrast CTA Head and neck MRV head TTE  Laboratory Studies: ESR/CRP  Medications: IV Toradol IV Depakote $RemoveBef'500mg'hGLFWBeKzM$  ASA 325 mg daily  Nursing Recommendations: When possible avoid opioid pain medications as this can lead to worsening headaches with rebound phenomenon  Consultations: If no improvement despite multiple med regimens will need a dedicated inpatient pain management evaluation if available Will need outpatient follow up with Neurology and PCP in 1-2 weeks  DVT Prophylaxis: Choice of Primary Team  Disposition: Neurology will follow   Free Text: Mag 2 mg IV push   Metrics: [facility] Notification Time: 04/19/2021 02:19:10 Stamp Time: 04/19/2021 02:21:22 Callback Response Time: 04/19/2021 02:28:13   ----------------------------------------------------------------------------------------------------  Chief Complaint: headaceh x 2 weeks and vision changes  History of Present Illness: Patient is a 36 year old Female. 36 Spanish speaking F presents to ED with c/o intermittent headaches and altered vision that has been ongoing for weeks but has gotten much worse over the past 2 days. Vision changes are in  both eyes and she sees flashes of light or darkening (looses all her vision) which has never happeend before with a headache. Headache is a a 10/10. The headache started 2 weeks ago. Has headaches in the past and has hx migraines. Has associated nausea and emesis. Had numbness in her feet and face and mouth. Has had a generalized weakness.  She is not on birth control. no fever or chills, or SOB. Taking meds for migraine Sunday and takes aleve twice a week.    Past Medical History:      There is no history of Stroke  Medications:  No Anticoagulant use  No Antiplatelet use Reviewed EMR for current medications  Allergies:  Reviewed  Social History: Smoking: No Alcohol Use: No Drug Use: No  Family History:  There is no family history of premature cerebrovascular disease pertinent to this consultation  ROS : 14 Points Review of Systems was performed and was negative except mentioned in HPI.  Past Surgical History: There Is No Surgical History Contributory To Todays Visit   Examination: BP(110/74), Pulse(72), Blood Glucose(104) 1A: Level of Consciousness - Alert; keenly responsive + 0 1B: Ask Month and Age - Both Questions Right + 0 1C: Blink Eyes & Squeeze Hands - Performs Both Tasks + 0 2: Test Horizontal Extraocular Movements - Normal + 0 3: Test Visual Fields - Complete Hemianopia + 2 4: Test Facial Palsy (Use Grimace if Obtunded) - Normal symmetry + 0 5A: Test Left Arm Motor Drift - No Drift for 10 Seconds + 0 5B: Test Right Arm Motor Drift - No Drift for 10 Seconds + 0 6A: Test Left Leg Motor Drift - No Drift for 5 Seconds + 0 6B: Test Right Leg Motor  Drift - No Drift for 5 Seconds + 0 7: Test Limb Ataxia (FNF/Heel-Shin) - No Ataxia + 0 8: Test Sensation - Mild-Moderate Loss: Less Sharp/More Dull + 1 9: Test Language/Aphasia - Normal; No aphasia + 0 10: Test Dysarthria - Normal + 0 11: Test Extinction/Inattention - No abnormality + 0  NIHSS Score: 3 NIHSS Free  Text : decreased on the R face and arm and leg, blurry in all fields R>L     Patient / Family was informed the Neurology Consult would occur via TeleHealth consult by way of interactive audio and video telecommunications and consented to receiving care in this manner.  Patient is being evaluated for possible acute neurologic impairment and high probability of imminent or life - threatening deterioration.I spent total of 35 minutes providing care to this patient, including time for face to face visit via telemedicine, review of medical records, imaging studies and discussion of findings with providers, the patient and / or family.   Dr Deitra Mayo   [facility] [phone]  Case [id number]

## 2021-04-19 NOTE — ED Notes (Signed)
Pt departed the dept via [facility].  

## 2021-04-19 NOTE — ED Notes (Signed)
Pt able to use bedside commode with minimal assistance and walk to and from ED exam room sink with no assistance.

## 2021-04-19 NOTE — Progress Notes (Signed)
Occupational Therapy Evaluation  Patient lives at home with spouse and children and is typically independent with ADL/IADLs. Patient reporting R sided weakness, denies parasthesia in upper extremity but reporting numbness down entire R LE. Patient with significant weakness in R dominant UE compared to L however also having some difficulty following MMT instructions despite use of Spanish interpreter. Patient overall able to perform self care tasks of lower body dressing and feeding however compensating opening containers using L non dominant hand. Patient min G with functional ambulation for balance due to R LE numbness. Recommend continued acute OT services in order to facilitate D/C to venue listed below.    04/19/21 1300  OT Visit Information  Last OT Received On 04/19/21  Assistance Needed +1  PT/OT/SLP Co-Evaluation/Treatment Yes  Reason for Co-Treatment To address functional/ADL transfers  PT goals addressed during session Mobility/safety with mobility  OT goals addressed during session ADL's and self-care  History of Present Illness Angela Rangel is a 36 y.o. female with history of chronic headaches has been using ibuprofen over the last 1 week he has been experiencing severe headache mostly in the right side around the vertex pounding in nature severe with blurred vision but no weakness of the extremities.  Has been having nausea vomiting and a headache has been persistent  Precautions  Precautions Fall  Restrictions  Weight Bearing Restrictions No  Home Living  Family/patient expects to be discharged to: Private residence  Living Arrangements Children;Spouse/significant other  Available Help at Discharge Family  Type of Home House  Home Access Stairs to enter  Entrance Stairs-Number of Steps 3-4  Home Layout One level  Bathroom Environmental health practitioner None  Prior Function  Prior Level of Function  Independent/Modified  Independent  Communication  Communication Prefers language other than English (Spanish)  Pain Assessment  Pain Assessment Faces  Faces Pain Scale 8  Pain Location points to crown of head  Pain Descriptors / Indicators Discomfort;Grimacing  Pain Intervention(s) Monitored during session  Cognition  Arousal/Alertness Awake/alert  Behavior During Therapy Flat affect  Overall Cognitive Status Impaired/Different from baseline  Area of Impairment Orientation  Orientation Level Disoriented to;Time  General Comments Needing increased time to state month/year, did not know date.  Upper Extremity Assessment  Upper Extremity Assessment RUE deficits/detail (Difficult to assess 2* language barrier)  RUE Deficits / Details significant weakness throughout R UE grip 2+/5 compared to L grip. Elbow 2+/5 however difficulty understanding directions for MMT despite interpreter to assist. Denies parasthesia primarily complaint R sided weakness  RUE Coordination decreased fine motor (Patient compensating using L hand to open containers and stabilizing with R hand)  Lower Extremity Assessment  Lower Extremity Assessment Defer to PT evaluation  Cervical / Trunk Assessment  Cervical / Trunk Assessment Normal  ADL  Overall ADL's  Needs assistance/impaired  Eating/Feeding Independent;Sitting  Eating/Feeding Details (indicate cue type and reason) Compensating using non dominant hand to open containers  Grooming Set up;Sitting  Upper Body Bathing Set up;Sitting  Lower Body Bathing Min guard;Sitting/lateral leans;Sit to/from stand  Upper Body Dressing  Set up;Sitting  Lower Body Dressing Min guard;Sitting/lateral leans;Sit to/from stand  Lower Body Dressing Details (indicate cue type and reason) Seated patient is able to don socks using figure 4 method and needing increased time. Min G in standing for balance due to numbness in R LE  Toilet Transfer Min guard;Ambulation  Toilet Transfer Details (indicate cue  type and reason) Simulated ambulating in  room. Min G for safety with balance as patient reporting numbness and weakness in R LE  Toileting- Clothing Manipulation and Hygiene Min guard;Sit to/from stand  Functional mobility during ADLs Min guard  Bed Mobility  Overal bed mobility Modified Independent  Balance  Overall balance assessment Needs assistance  Sitting-balance support No upper extremity supported;Feet supported  Sitting balance-Leahy Scale Good  Sitting balance - Comments leans forward to place socks on feet.  Standing balance support No upper extremity supported;Single extremity supported  Standing balance-Leahy Scale Fair  Standing balance comment Static stand without UE support, dynamic needing HHA +1  OT - End of Session  Activity Tolerance Patient tolerated treatment well  Patient left in bed;with call bell/phone within reach  Nurse Communication Mobility status  OT Assessment  OT Recommendation/Assessment Patient needs continued OT Services  OT Visit Diagnosis Unsteadiness on feet (R26.81);Muscle weakness (generalized) (M62.81)  OT Problem List Decreased strength;Decreased range of motion;Impaired balance (sitting and/or standing);Decreased coordination;Impaired UE functional use;Decreased activity tolerance  OT Plan  OT Frequency (ACUTE ONLY) Min 2X/week  OT Treatment/Interventions (ACUTE ONLY) Self-care/ADL training;Therapeutic exercise;Therapeutic activities;Patient/family education;Balance training  AM-PAC OT "6 Clicks" Daily Activity Outcome Measure (Version 2)  Help from another person eating meals? 4  Help from another person taking care of personal grooming? 3  Help from another person toileting, which includes using toliet, bedpan, or urinal? 3  Help from another person bathing (including washing, rinsing, drying)? 3  Help from another person to put on and taking off regular upper body clothing? 3  Help from another person to put on and taking off regular lower  body clothing? 3  6 Click Score 19  Progressive Mobility  What is the highest level of mobility based on the progressive mobility assessment? Level 4 (Walks with assist in room) - Balance while marching in place and cannot step forward and back - Complete  Activity Ambulated with assistance in room  OT Recommendation  Follow Up Recommendations Outpatient OT  Assistance recommended at discharge Intermittent Supervision/Assistance  Patient can return home with the following A little help with walking and/or transfers;A little help with bathing/dressing/bathroom;Assistance with cooking/housework;Help with stairs or ramp for entrance  Functional Status Assessent Patient has had a recent decline in their functional status and demonstrates the ability to make significant improvements in function in a reasonable and predictable amount of time.  OT Equipment None recommended by OT  Individuals Consulted  Consulted and Agree with Results and Recommendations Patient  Acute Rehab OT Goals  Patient Stated Goal "my R side is weak"  OT Goal Formulation With patient  Time For Goal Achievement 05/03/21  Potential to Achieve Goals Good  OT Time Calculation  OT Start Time (ACUTE ONLY) 0907  OT Stop Time (ACUTE ONLY) 4132  OT Time Calculation (min) 21 min  OT General Charges  $OT Visit 1 Visit  OT Evaluation  $OT Eval Low Complexity 1 Low  Written Expression  Dominant Hand Right   Marlyce Huge OT OT pager: [phone]

## 2021-04-19 NOTE — ED Notes (Signed)
Pt is having a tele neurology exam with use of walie interpreter

## 2021-04-19 NOTE — ED Notes (Signed)
Per MRI tech, pt was unable to tolerate MRI to complete MRV.

## 2021-04-19 NOTE — Progress Notes (Signed)
Same day note  Patient seen and examined at bedside.  Patient was admitted to the hospital for intractable headache.  At the time of my evaluation, patient complains of headache is slightly better  Physical examination reveals female lying in bed with eyes closed.  Spanish interpreter used.  Body mass index is 28.29 kg/m.   Laboratory data and imaging was reviewed  Assessment and Plan.  Intractable headache.  Migraine with aura.  Seen by neurology.  Recommending MRI of the brain MRV to rule out cerebral venous thrombosis.  CTA of the head and neck negative for vessel occlusion.  CT head without any acute intracranial abnormality.  Patient takes Aleve for migraine.  Physical therapy was consulted to recommend outpatient PT on discharge.  Urine drug screen was negative.  UA negative.  CRP 0.9.  Sed rate 5.  No Charge  Signed,  Delila Pereyra, MD [facility]

## 2021-04-19 NOTE — Evaluation (Addendum)
Physical Therapy Evaluation Patient Details Name: Angela Rangel MRN: [id number] DOB: 1986-02-26 Today's Date: 04/19/2021  History of Present Illness  Angela Rangel is a 36 y.o. female with history of chronic headaches has been using ibuprofen over the last 1 week he has been experiencing severe headache mostly in the right side around the vertex pounding in nature severe with blurred vision but no weakness of the extremities.  Has been having nausea vomiting and a headache has been persistent, Negative CTA findings.  Clinical Impression  The patient resting in dark room. Reports crown of Head aching and light is uncomfortable.  Patient reports decreased sensation right leg from thigh to foot and reports tingling. Patient does appreciate light touch.  Patient with noted impaired right leg coordination to test heel/shin, ,decreased effort to dorsiflex right ankle.  When ambulating, patient frequently placed  her right foot on top of the left foot after taking a step, indicating tingling. Decreased step length and dorsiflexion during ambulation. Patient required Hand hold support for short distance in room.  Patient asking when will she be able to go home.  Pt admitted with above diagnosis.  Pt currently with functional limitations due to the deficits listed below (see PT Problem List). Pt will benefit from skilled PT to increase their independence and safety with mobility to allow discharge to the venue listed below.           Recommendations for follow up therapy are one component of a multi-disciplinary discharge planning process, led by the attending physician.  Recommendations may be updated based on patient status, additional functional criteria and insurance authorization.  Follow Up Recommendations Outpatient PT (if symptoms persist)    Assistance Recommended at Discharge Frequent or constant Supervision/Assistance  Patient can return home with the following  A little  help with walking and/or transfers;Assistance with cooking/housework;Assist for transportation;Help with stairs or ramp for entrance;A little help with bathing/dressing/bathroom    Equipment Recommendations  (unsure, ? RW)  Recommendations for Other Services       Functional Status Assessment Patient has had a recent decline in their functional status and demonstrates the ability to make significant improvements in function in a reasonable and predictable amount of time.     Precautions / Restrictions Precautions Precautions: Fall      Mobility  Bed Mobility Overal bed mobility: Modified Independent                  Transfers Overall transfer level: Needs assistance Equipment used: 1 person hand held assist Transfers: Sit to/from Stand Sit to Stand: Min assist           General transfer comment: tending to not place full weight to right leg    Ambulation/Gait Ambulation/Gait assistance: Min assist Gait Distance (Feet): 20 Feet Assistive device: 1 person hand held assist Gait Pattern/deviations: Step-to pattern, Decreased stance time - right, Decreased step length - right, Decreased weight shift to right, Decreased dorsiflexion - right Gait velocity: decreased     General Gait Details: abnormal gait pattern,  places right foot on top of the left foot after taking a step.  Stairs            Wheelchair Mobility    Modified Rankin (Stroke Patients Only)       Balance Overall balance assessment: Needs assistance Sitting-balance support: No upper extremity supported, Feet supported Sitting balance-Leahy Scale: Good Sitting balance - Comments: leans forward to place socks on feet.   Standing balance  support: No upper extremity supported, During functional activity Standing balance-Leahy Scale: Poor Standing balance comment: decreased right leg support.                             Pertinent Vitals/Pain Pain Assessment Pain Assessment:  Faces Faces Pain Scale: Hurts whole lot Pain Location: points to crown of head Pain Descriptors / Indicators: Discomfort, Grimacing Pain Intervention(s): Limited activity within patient's tolerance    Home Living Family/patient expects to be discharged to:: Private residence Living Arrangements: Children;Spouse/significant other   Type of Home: House Home Access: Stairs to enter   Secretary/administrator of Steps: 3-4   Home Layout: One level        Prior Function Prior Level of Function : Independent/Modified Independent                     Hand Dominance   Dominant Hand: Right    Extremity/Trunk Assessment        Lower Extremity Assessment Lower Extremity Assessment: RLE deficits/detail RLE Deficits / Details: patient indicates numbness from thigh to foot. Patient appreciates LT, Patient has difficulty with Heel shin , does not perform fully, noted decreased dorsiflexion when asked but did not she could  move the foot when placing sock and places the right foot ontop of the left foot after taking a step. RLE Sensation: decreased light touch;decreased proprioception RLE Coordination: decreased fine motor    Cervical / Trunk Assessment Cervical / Trunk Assessment: Normal  Communication   Communication: Prefers language other than English (Spanish)  Cognition Arousal/Alertness: Awake/alert Behavior During Therapy: WFL for tasks assessed/performed, Flat affect Overall Cognitive Status: Impaired/Different from baseline Area of Impairment: Orientation                 Orientation Level: Time             General Comments: did not know date        General Comments      Exercises     Assessment/Plan    PT Assessment Patient needs continued PT services  PT Problem List Decreased strength;Decreased balance;Decreased mobility;Impaired sensation;Decreased activity tolerance;Decreased safety awareness       PT Treatment Interventions DME  instruction;Therapeutic activities;Gait training;Therapeutic exercise;Patient/family education;Functional mobility training;Neuromuscular re-education    PT Goals (Current goals can be found in the Care Plan section)  Acute Rehab PT Goals Patient Stated Goal: When can I go home? PT Goal Formulation: With patient Time For Goal Achievement: 05/03/21 Potential to Achieve Goals: Good    Frequency Min 3X/week     Co-evaluation PT/OT/SLP Co-Evaluation/Treatment: Yes Reason for Co-Treatment: To address functional/ADL transfers;For patient/therapist safety PT goals addressed during session: Mobility/safety with mobility OT goals addressed during session: ADL's and self-care       AM-PAC PT "6 Clicks" Mobility  Outcome Measure Help needed turning from your back to your side while in a flat bed without using bedrails?: None Help needed moving from lying on your back to sitting on the side of a flat bed without using bedrails?: None Help needed moving to and from a bed to a chair (including a wheelchair)?: A Little Help needed standing up from a chair using your arms (e.g., wheelchair or bedside chair)?: A Little Help needed to walk in hospital room?: A Little Help needed climbing 3-5 steps with a railing? : A Lot 6 Click Score: 19    End of Session   Activity Tolerance: Patient  limited by pain;Patient limited by fatigue Patient left: in bed;with call bell/phone within reach (seated on  stretcher for breakfast.) Nurse Communication: Mobility status PT Visit Diagnosis: Unsteadiness on feet (R26.81);Other abnormalities of gait and mobility (R26.89);Other symptoms and signs involving the nervous system (R29.898)    Time: 5732-2025 PT Time Calculation (min) (ACUTE ONLY): 22 min   Charges:   PT Evaluation $PT Eval Low Complexity: 1 Low          Blanchard Kelch PT Acute Rehabilitation Services Pager [phone] Office [phone]   Rada Hay 04/19/2021, 10:53 AM

## 2021-04-19 NOTE — ED Notes (Signed)
Pt to MRI at this time.

## 2021-04-19 NOTE — ED Notes (Signed)
Hospitalist at the bedside 

## 2021-04-20 DIAGNOSIS — R519 Headache, unspecified: Secondary | ICD-10-CM

## 2021-04-20 MED ORDER — KETOROLAC TROMETHAMINE 30 MG/ML IJ SOLN
30.0000 mg | Freq: Once | INTRAMUSCULAR | Status: AC
  Administered 2021-04-20: 23:00:00 30 mg via INTRAVENOUS
  Filled 2021-04-20: qty 1

## 2021-04-20 MED ORDER — LACTATED RINGERS IV BOLUS
500.0000 mL | Freq: Once | INTRAVENOUS | Status: AC
  Administered 2021-04-20: 23:00:00 500 mL via INTRAVENOUS

## 2021-04-20 MED ORDER — LACTATED RINGERS IV SOLN: INTRAVENOUS | Status: DC

## 2021-04-20 MED ORDER — ONDANSETRON HCL 4 MG/2ML IJ SOLN
4.0000 mg | Freq: Four times a day (QID) | INTRAMUSCULAR | Status: DC | PRN
  Administered 2021-04-20 – 2021-04-21 (×4): 4 mg via INTRAVENOUS
  Filled 2021-04-20 (×4): qty 2

## 2021-04-20 MED ORDER — LIDOCAINE HCL (PF) 1 % IJ SOLN
INTRAMUSCULAR | Status: AC
  Filled 2021-04-20: qty 5

## 2021-04-20 MED ORDER — LIDOCAINE HCL (PF) 1 % IJ SOLN: 5.0000 mL | Freq: Once | INTRAMUSCULAR | Status: DC

## 2021-04-20 NOTE — Progress Notes (Signed)
Occupational Therapy Treatment Patient Details Name: Angela Rangel MRN: [id number] DOB: 1985-09-25 Today's Date: 04/20/2021   History of present illness Angela Rangel is a 36 y.o. female with history of chronic headaches has been using ibuprofen over the last 1 week he has been experiencing severe headache mostly in the right side around the vertex pounding in nature severe with blurred vision but no weakness of the extremities.  Has been having nausea vomiting and a headache has been persistent   OT comments  Patient seen by skilled OT with hospital interpretor to assist with communication. Patient seen at bed level due to scheduled for LP at bedside today. Patient instructed in AAROM exercises for RUE with shoulder flexion and extension with patient demonstrating difficulty lowering arm to bed during extension.  Shoulder ad/abduction AAROM and AROM for RUE elbow flexion and extension. Patient able to use level one theraband for RUE elbow flexion.  Patient instructed in yellow therapy putty exercises for pinch and gross grasp and instructed in finger opposition exercises. Patient to continue to be followed by acute OT.    Recommendations for follow up therapy are one component of a multi-disciplinary discharge planning process, led by the attending physician.  Recommendations may be updated based on patient status, additional functional criteria and insurance authorization.    Follow Up Recommendations  Outpatient OT    Assistance Recommended at Discharge Intermittent Supervision/Assistance  Patient can return home with the following  A little help with walking and/or transfers;A little help with bathing/dressing/bathroom;Assistance with cooking/housework;Help with stairs or ramp for entrance   Equipment Recommendations  None recommended by OT    Recommendations for Other Services      Precautions / Restrictions Precautions Precautions: Fall       Mobility Bed  Mobility                    Transfers                         Balance                                           ADL either performed or assessed with clinical judgement   ADL                                         General ADL Comments: treatment focused on RUE HEP    Extremity/Trunk Assessment Upper Extremity Assessment RUE Deficits / Details: significant weakness throughout R UE grip 2+/5 compared to L grip. Elbow 2+/5 however difficulty understanding directions for MMT despite interpreter to assist. Denies parasthesia primarily complaint R sided weakness RUE Coordination: decreased fine motor            Vision       Perception     Praxis      Cognition Arousal/Alertness: Awake/alert Behavior During Therapy: Flat affect Overall Cognitive Status: Impaired/Different from baseline Area of Impairment: Orientation                 Orientation Level: Disoriented to, Time             General Comments: followed commands via interpretor        Exercises Exercises: General Upper Extremity General  Exercises - Upper Extremity Shoulder Flexion: AAROM, Right, 10 reps, Supine Shoulder Extension: AAROM, Right, 10 reps, Supine Shoulder ABduction: AAROM, Right, 10 reps, Supine Shoulder ADduction: AAROM, Right, 10 reps, Supine Elbow Flexion: AROM, Strengthening, Right, 10 reps, Supine Elbow Extension: AROM, Right, 10 reps, Supine Digit Composite Flexion: AROM, Right, 10 reps, Supine Composite Extension: AROM, Right, 10 reps, Supine    Shoulder Instructions       General Comments      Pertinent Vitals/ Pain       Pain Assessment Pain Assessment: Faces Faces Pain Scale: No hurt  Home Living                                          Prior Functioning/Environment              Frequency  Min 2X/week        Progress Toward Goals  OT Goals(current goals can now be found in  the care plan section)  Progress towards OT goals: Progressing toward goals  Acute Rehab OT Goals Patient Stated Goal: none stated OT Goal Formulation: With patient Time For Goal Achievement: 05/03/21 Potential to Achieve Goals: Good ADL Goals Pt/caregiver will Perform Home Exercise Program: Increased ROM;Increased strength;Right Upper extremity;With theraband;With theraputty;Independently;With written HEP provided  Plan Discharge plan remains appropriate    Co-evaluation                 AM-PAC OT "6 Clicks" Daily Activity     Outcome Measure   Help from another person eating meals?: None Help from another person taking care of personal grooming?: A Little Help from another person toileting, which includes using toliet, bedpan, or urinal?: A Little Help from another person bathing (including washing, rinsing, drying)?: A Little Help from another person to put on and taking off regular upper body clothing?: A Little Help from another person to put on and taking off regular lower body clothing?: A Little 6 Click Score: 19    End of Session    OT Visit Diagnosis: Unsteadiness on feet (R26.81);Muscle weakness (generalized) (M62.81)   Activity Tolerance Patient tolerated treatment well   Patient Left in bed;with call bell/phone within reach;with family/visitor present   Nurse Communication Other (comment) (treatment focus)        Time: [id number] OT Time Calculation (min): 15 min  Charges: OT General Charges $OT Visit: 1 Visit OT Treatments $Therapeutic Exercise: 8-22 mins  Angela Rangel, [place]  Pager [phone] Office [place] 04/20/2021, 12:54 PM

## 2021-04-20 NOTE — Progress Notes (Signed)
SLP Cancellation Note  Patient Details Name: Angela Rangel MRN: [id number] DOB: 01/14/1986   Cancelled treatment:       Reason Eval/Treat Not Completed: SLP screened, no needs identified, will sign off   Cybill Uriegas, Riley Nearing 04/20/2021, 10:19 AM

## 2021-04-20 NOTE — Progress Notes (Signed)
Notified by RN that pt having nausea and vomiting and pt complaining of headache Reviewed chart, neurology following. LP could not be obtained today and will try tomorrow per RN Give dose of zofran now which is a little early.  Given LR 500 ml bolus and then continue LR at 100 ml overnight Given Toradol 30 mg IV for pain

## 2021-04-20 NOTE — Progress Notes (Signed)
PROGRESS NOTE  Angela Rangel  DOB: Sep 21, 1985  PCP: Patient, No Pcp Per (Inactive) [id number]  DOA: 04/18/2021  LOS: 0 days  Hospital Day: 3  Chief Complaint  Patient presents with   Headache   Brief narrative: Cynda Soule is a 36 y.o. female with PMH significant for chronic headaches.  Patient presented to the ED on 04/18/2021 with severe pounding headache mostly on the right side for a week with blurred vision and weakness in the extremities nonresponding to ibuprofen.  Associated with nausea, vomiting.  In the ED, patient was given migraine cocktail despite which headache persisted. CT head unremarkable Neurology was consulted Admitted to hospitalist service MRI MRV obtained. 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  Subjective: Patient was seen and examined this morning.  Pleasant young Hispanic female.  Lying down in bed.  Not in distress.  Headache persists.  Assessment/Plan: Persistent headache Suspect idiopathic intracranial hypertension -Patient has chronic headache, now presented with 2 weeks of headache, stabbing quality with blurred vision.  Headache worsens on standing up and gets better when lying down -Imagings as above. -Neurology consultation was obtained. -Noted a plan for spinal tap this afternoon.  It will help to establish diagnosis of intracranial hypertension as well as rule out low pressure headache.  Mobility: Encourage ambulation Living condition:  Goals of care:   Code Status: Full Code  Nutritional status: Body mass index is 28.29 kg/m.      Diet:  Diet Order             Diet Heart Room service appropriate? Yes; Fluid consistency: Thin  Diet effective now                  DVT prophylaxis:  SCD's Start: 04/19/21 0605   Antimicrobials: None Fluid: None Consultants:  Neurology Family Communication: None at bedside  Status is: Observation  Continue in-hospital care because: Pending LP this afternoon Level of care: Telemetry Medical   Dispo: The patient is from: Home              Anticipated d/c is to: Home              Patient currently is not medically stable to d/c.   Difficult to place patient No     Infusions:    Scheduled Meds:  lidocaine (PF)        stroke: mapping our early stages of recovery book   Does not apply Once   aspirin  300 mg Rectal Daily   Or   aspirin  325 mg Oral Daily   lidocaine (PF)  5 mL Other Once    PRN meds: acetaminophen **OR** acetaminophen (TYLENOL) oral liquid 160 mg/5 mL **OR** acetaminophen, traMADol   Antimicrobials: Anti-infectives (From admission, onward)    None       Objective: Vitals:   04/20/21 0803 04/20/21 1220  BP: 102/69 107/75  Pulse: 60 64  Resp: 17 17  Temp: 98.2 F (36.8 C) 98.4 F (36.9 C)  SpO2: 98% 99%    Intake/Output Summary (Last 24 hours) at 04/20/2021 1318 Last data filed at 04/19/2021 1800 Gross per 24 hour  Intake 786.67 ml  Output --  Net 786.67 ml   Filed Weights   04/18/21 1909  Weight: 77.1 kg   Weight change:  Body mass index is 28.29 kg/m.   Physical Exam: General exam: Pleasant  young Hispanic female. Skin: No rashes, lesions or ulcers. HEENT: Atraumatic, normocephalic, no obvious bleeding Lungs: Clear to auscultation bilaterally CVS: Regular rate and rhythm, no murmur GI/Abd soft, nontender, nondistended, bowel sound present CNS: Alert, awake, oriented x3 Psychiatry: Sad affect Extremities: No pedal edema, no calf tenderness  Data Review: I have personally reviewed the laboratory data and studies available.  F/u labs none Unresulted Labs (From admission, onward)    None       Signed, Lorin Glass, MD [facility] 04/20/2021

## 2021-04-20 NOTE — Consult Note (Addendum)
Neurology Consultation  Reason for Consult: Unremitting headaches for 2 weeks, visual changes Referring Physician: Dr. Pola Corn  CC: Headache, blurred vision  History is obtained from: Patient, chart-Spanish interpreter used  HPI: Angela Rangel is a 36 y.o. female with no significant past medical history presented to the emergency room on 124 for evaluation of headaches that have been going on for 2 weeks.  She also reported blurred vision to the point where it is difficult for her to focus on objects.  Describes the headaches as sharp stabbing type with mild relief with medication such as Tylenol.  No aggravating factors.  Feels better when laying down and the headache worsens when she is standing up.  Denies any ringing in her ears but reports in addition to blurred vision almost tunnel vision kind of vision loss which has worsened over the past 2 weeks.  No diurnal variation-says headaches can be all day and all night and sometimes just during the day and sometimes just during the night. No prior history of headaches.  No chest pain shortness of breath. Reports nausea and reports episodic vomiting that has happened since the headache started. She has not had a single headache free days since this headache started. No tingling numbness or weakness although initially she describes some right-sided numbness. Not much relief with migraine cocktail. She was seen by telemedicine neurology consultation and admitted for stroke work-up along with an MRI and MRV-both of which are done.  No evidence of acute stroke.  The MRV shows stenosis of distal transverse sinuses bilaterally-nonspecific finding but could be seen in idiopathic intracranial hypertension but notably, there is no empty sella on the MRI.  LKW: 2 weeks ago tpa given?: no, not a stroke Premorbid modified Rankin scale (mRS): 0  ROS: Full ROS was performed and is negative except as noted in the HPI.   Past Medical History:   Diagnosis Date   Medical history non-contributory    Family History  Problem Relation Age of Onset   Diabetes type II Other    Social History:   reports that she has never smoked. She has never used smokeless tobacco. She reports that she does not drink alcohol and does not use drugs.  Medications  Current Facility-Administered Medications:     stroke: mapping our early stages of recovery book, , Does not apply, Once, Eduard Clos, MD   acetaminophen (TYLENOL) tablet 650 mg, 650 mg, Oral, Q4H PRN, 650 mg at 04/19/21 2032 **OR** acetaminophen (TYLENOL) 160 MG/5ML solution 650 mg, 650 mg, Per Tube, Q4H PRN **OR** acetaminophen (TYLENOL) suppository 650 mg, 650 mg, Rectal, Q4H PRN, Eduard Clos, MD   aspirin suppository 300 mg, 300 mg, Rectal, Daily **OR** aspirin tablet 325 mg, 325 mg, Oral, Daily, Eduard Clos, MD, 325 mg at 04/19/21 0939   traMADol (ULTRAM) tablet 50-100 mg, 50-100 mg, Oral, Q6H PRN, Calvert Cantor, MD, 100 mg at 04/19/21 1739   Exam: Current vital signs: BP 102/69 (BP Location: Right Arm)    Pulse 60    Temp 98.2 F (36.8 C) (Oral)    Resp 17    Ht 5\' 5"  (1.651 m)    Wt 77.1 kg    LMP 04/18/2021 Comment: negative beta HCG 04/19/21   SpO2 98%    BMI 28.29 kg/m  Vital signs in last 24 hours: Temp:  [97.7 F (36.5 C)-98.3 F (36.8 C)] 98.2 F (36.8 C) (01/25 0803) Pulse Rate:  [56-70] 60 (01/25 0803) Resp:  [13-19]  17 (01/25 0803) BP: (99-114)/(68-80) 102/69 (01/25 0803) SpO2:  [96 %-100 %] 98 % (01/25 0803)  GENERAL: Awake, alert in NAD HEENT: - Normocephalic and atraumatic, dry mm, no LN++, no Thyromegally.  Funduscopic exam LUNGS - Clear to auscultation bilaterally with no wheezes CV - S1S2 RRR, no m/r/g, equal pulses bilaterally. ABDOMEN - Soft, nontender, nondistended with normoactive BS Ext: warm, well perfused, intact peripheral pulses, no edema  NEURO:  Mental Status: AA&Ox3  Language: speech is clear.  Naming, repetition,  fluency, and comprehension intact. Cranial Nerves: PERRL. EOMI, diminished visual acuity bilaterally, there is also on confrontation-possible bilateral visual field restriction although I am not very confident on this finding, no facial asymmetry, facial sensation intact, hearing intact, tongue/uvula/soft palate midline, normal sternocleidomastoid and trapezius muscle strength. No evidence of tongue atrophy or fibrillations Motor: 5/5 without drift Tone: is normal and bulk is normal Sensation-diminished in the right in comparison to the left along with reduced vibration on the right forehead in comparison to the left Coordination: FTN intact bilaterally, no ataxia in BLE. Gait- deferred  NIHSS-1   Labs I have reviewed labs in epic and the results pertinent to this consultation are:  CBC    Component Value Date/Time   WBC 10.8 (H) 04/18/2021 2013   RBC 5.06 04/18/2021 2013   HGB 14.8 04/18/2021 2013   HCT 45.4 04/18/2021 2013   PLT 312 04/18/2021 2013   MCV 89.7 04/18/2021 2013   MCH 29.2 04/18/2021 2013   MCHC 32.6 04/18/2021 2013   RDW 12.5 04/18/2021 2013   LYMPHSABS 2.2 08/25/2019 2233   MONOABS 0.8 08/25/2019 2233   EOSABS 0.2 08/25/2019 2233   BASOSABS 0.0 08/25/2019 2233    CMP     Component Value Date/Time   NA 140 04/18/2021 2013   K 4.0 04/18/2021 2013   CL 102 04/18/2021 2013   CO2 27 04/18/2021 2013   GLUCOSE 104 (H) 04/18/2021 2013   BUN 13 04/18/2021 2013   CREATININE 0.57 04/18/2021 2013   CREATININE 0.58 08/08/2013 0910   CALCIUM 9.5 04/18/2021 2013   PROT 7.1 08/25/2019 2233   ALBUMIN 3.5 08/25/2019 2233   AST 17 08/25/2019 2233   ALT 21 08/25/2019 2233   ALKPHOS 55 08/25/2019 2233   BILITOT 0.4 08/25/2019 2233   GFRNONAA >60 04/18/2021 2013   GFRNONAA >89 08/08/2013 0910   GFRAA >60 08/26/2019 0346   GFRAA >89 08/08/2013 0910    Lipid Panel     Component Value Date/Time   CHOL 170 04/19/2021 0638   TRIG 72 04/19/2021 0638   HDL 28 (L)  04/19/2021 0638   CHOLHDL 6.1 04/19/2021 0638   VLDL 14 04/19/2021 0638   LDLCALC 128 (H) 04/19/2021 0638    LDL 128 A1c 5.5   Imaging I have reviewed the images obtained:  MRI head and MRV-both of which are done.  No evidence of acute stroke.  The MRV shows stenosis of distal transverse sinuses bilaterally-nonspecific finding but could be seen in idiopathic intracranial hypertension but notably, there is no empty sella on the MRI.  CT angiography of the head and neck negative.  Mucosal thickening in the ethmoid sinuses bilaterally.   2D echo-LVEF 60 to 65%, mitral valve normal.  Aortic valve normal.  Atrial septum grossly normal  Assessment:  36 year old with 2 weeks history of headache with stabbing quality along with blurred vision and what appears to be visual field constriction.  Her history is not characteristic of idiopathic intracranial hypertension-reports worsening with  standing up and getting better with laying down making a low pressure headache more likely but the visual field constriction and possible bilateral distal transverse sinus narrowing raises suspicion for idiopathic intracranial hypertension. In either case, due to her headache been persistent along with visual symptoms and imaging thus far in the form of MRI head, CT angiography of the head and neck and MRV of the head being negative, a spinal tap might aid in the diagnosis-based on the opening pressure to differentiate between low pressure headache versus idiopathic intracranial hypertension. Above said-her exam does have some discrepancies, especially in the sensory exam raising concern that her headache might not have an increase or decrease CSF pressure etiology. Other etiologies could be aseptic meningitis-she does not look sick for this to be a bacterial meningitis.  Stroke work-up has been unremarkable and there have been no risk factors as well as history not suggestive for stroke.  I would not go that  route.  Recommendations: Spinal tap this afternoon-we have already consented the patient and will perform a bedside LP in the afternoon as the schedule permits. Symptomatic treatment of headache per primary team as you are In case, she has increased intracranial pressure, we will drain the CSF to normal closing pressure and then start her on acetazolamide.  If she has low pressure headache, IV caffeine might be helpful along with hydration. I will update based on the LP results. Plan relayed to primary hospitalist via secure chat  -- Milon DikesAshish Terrick Allred, MD Neurologist [facility] Pager: [phone]

## 2021-04-20 NOTE — TOC Initial Note (Signed)
Transition of Care [facility]) - Initial/Assessment Note    Patient Details  Name: Angela Rangel MRN: [id number] Date of Birth: February 19, 1986  [facility]) CM/SW Contact:    Kermit Balo, RN Phone Number: 04/20/2021, 1:36 PM  Clinical Narrative:                 Patient lives at home with her spouse that works during the daytime. She does have family and friends that can check in on her.  No DME at home. Patient wasn't taking any medications at home.  Pt was agreeable to being set up with a [facility]. CM was able to get an appointment and placed it on the AVS. Outpatient recommended. Pt interested and information for outpatient on pts AVS. TOC following for medication assistance and DME needs.   Expected Discharge Plan: OP Rehab Barriers to Discharge: Continued Medical Work up, Inadequate or no insurance   Patient Goals and CMS Choice     Choice offered to / list presented to : Patient, Spouse  Expected Discharge Plan and Services Expected Discharge Plan: OP Rehab   Discharge Planning Services: CM Consult   Living arrangements for the past 2 months: Single Family Home                                      Prior Living Arrangements/Services Living arrangements for the past 2 months: Single Family Home Lives with:: Spouse Patient language and need for interpreter reviewed:: Yes (Spanish interpreter used) Do you feel safe going back to the place where you live?: Yes            Criminal Activity/Legal Involvement Pertinent to Current Situation/Hospitalization: No - Comment as needed  Activities of Daily Living Home Assistive Devices/Equipment: None ADL Screening (condition at time of admission) Patient's cognitive ability adequate to safely complete daily activities?: Yes Is the patient deaf or have difficulty hearing?: No Does the patient have difficulty seeing, even when wearing glasses/contacts?: No Does the patient have difficulty  concentrating, remembering, or making decisions?: No Patient able to express need for assistance with ADLs?: Yes Does the patient have difficulty dressing or bathing?: No Independently performs ADLs?: Yes (appropriate for developmental age) Does the patient have difficulty walking or climbing stairs?: No Weakness of Legs: Right Weakness of Arms/Hands: None  Permission Sought/Granted                  Emotional Assessment Appearance:: Appears stated age Attitude/Demeanor/Rapport: Engaged Affect (typically observed): Accepting Orientation: : Oriented to Self, Oriented to Place, Oriented to  Time, Oriented to Situation   Psych Involvement: No (comment)  Admission diagnosis:  Headache [R51.9] Intractable headache, unspecified chronicity pattern, unspecified headache type [R51.9] Patient Active Problem List   Diagnosis Date Noted   Headache 04/19/2021   Acute appendicitis 08/26/2019   SVD (spontaneous vaginal delivery) 02/08/2017   Encounter for induction of labor 02/07/2017   Cholestasis during pregnancy in third trimester 02/07/2017   Normal labor 03/27/2015   PCP:  Patient, No Pcp Per (Inactive) Pharmacy:   [id number] - [place] ([place]), [place] - [address] [place] ([place]) [place] [address] Phone: [phone] Fax: [phone]     Social Determinants of Health (SDOH) Interventions    Readmission Risk Interventions No flowsheet data found.

## 2021-04-21 ENCOUNTER — Inpatient Hospital Stay (HOSPITAL_COMMUNITY): Payer: Self-pay

## 2021-04-21 LAB — CSF CELL COUNT WITH DIFFERENTIAL
RBC Count, CSF: 265 /mm3 — ABNORMAL HIGH
Tube #: 3
WBC, CSF: 4 /mm3 (ref 0–5)

## 2021-04-21 LAB — PROTEIN, CSF: Total  Protein, CSF: 43 mg/dL (ref 15–45)

## 2021-04-21 LAB — GLUCOSE, CSF: Glucose, CSF: 52 mg/dL (ref 40–70)

## 2021-04-21 IMAGING — RF DG SPINAL PUNCT LUMBAR DIAG WITH FL CT GUIDANCE
2 series · 2 of 2 positions shown · non-contrast
Comparison: Brain MRs of [DATE].

CLINICAL DATA: Headache.

EXAM:
DIAGNOSTIC LUMBAR PUNCTURE UNDER FLUOROSCOPIC GUIDANCE

[Series 1: cp_standard · 0.08mm/px · 1 of 1 slices shown (1 of 2)]
[im 1/1]
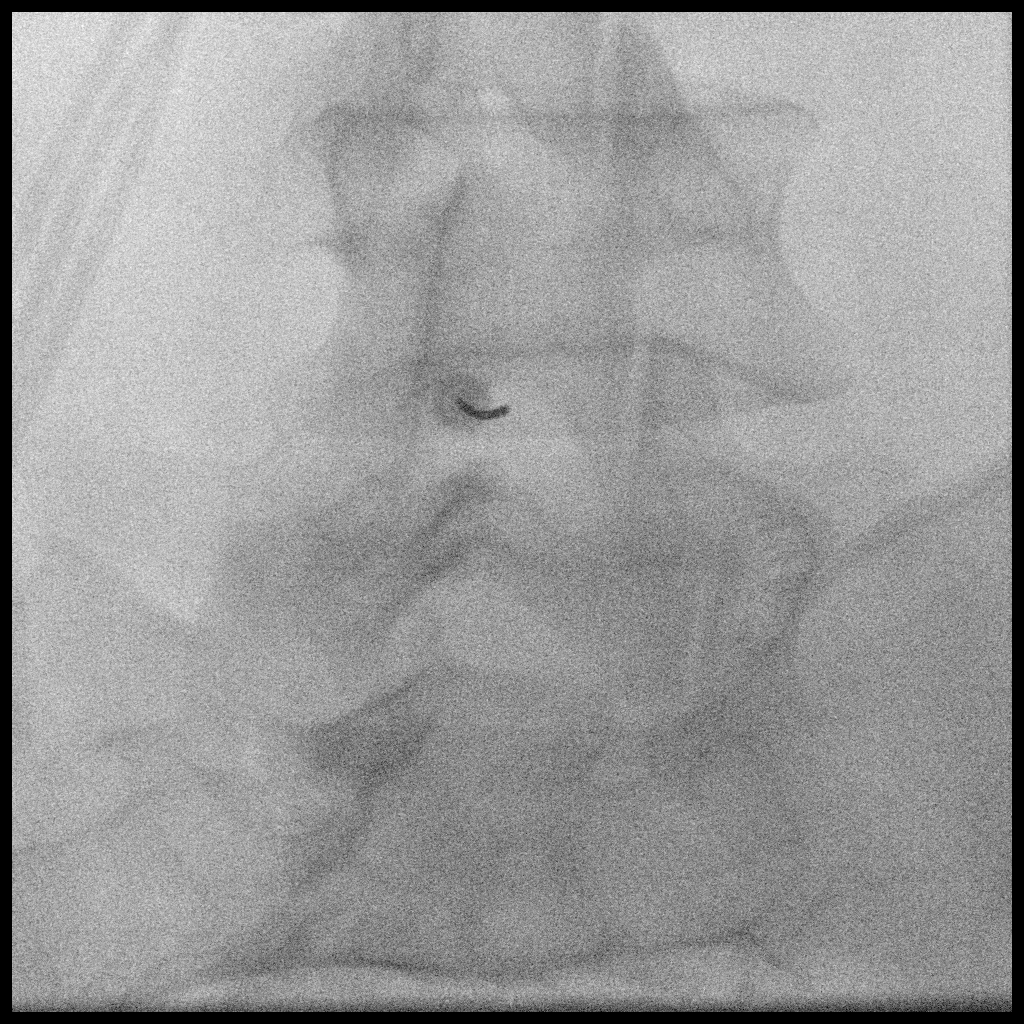

[Series 2: cp_standard · 0.08mm/px · 1 of 1 slices shown (2 of 2)]
[im 1/1]
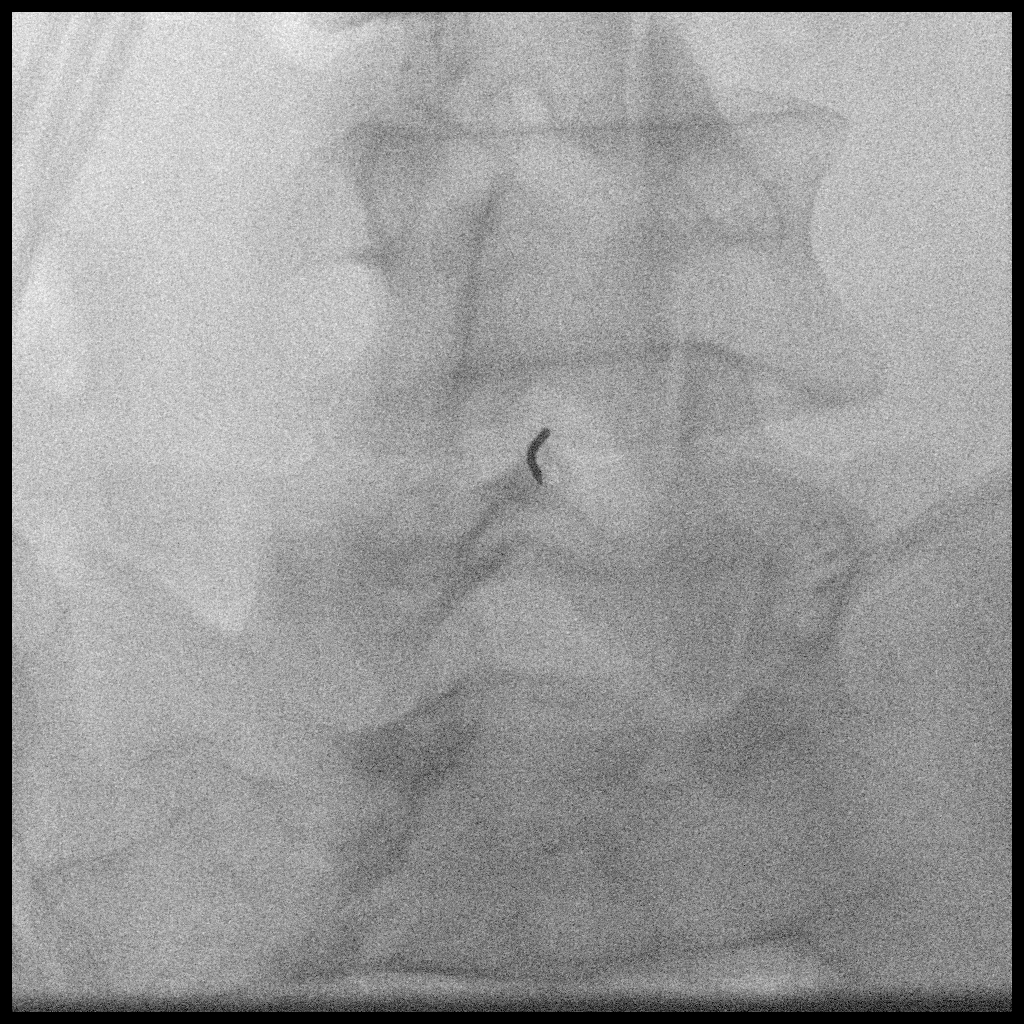

[2 of 2 positions shown; findings below may reference images not displayed]

FLUOROSCOPY TIME:  Fluoroscopy Time:  2 minutes and 43 seconds

Radiation Exposure Index (if provided by the fluoroscopic device):
Not applicable.

Number of Acquired Spot Images: 0

PROCEDURE:
Informed consent was obtained from the patient (via the interpreter)
prior to the procedure, including potential complications of
headache, allergy, and pain. With the patient prone, the lower back
was prepped with Betadine. 1% Lidocaine was used for local
anesthesia. Lumbar puncture was performed at the L4-5 level using a
20 gauge needle with return of minimally sanguinous CSF with an
opening pressure of 9 cm water. Eight ml of CSF were obtained for
laboratory studies. The patient tolerated the procedure well and
there were no apparent complications.
IMPRESSION: Non complicated lumbar puncture, as detailed above.

## 2021-04-21 MED ORDER — LIDOCAINE HCL (PF) 1 % IJ SOLN
5.0000 mL | Freq: Once | INTRAMUSCULAR | Status: AC
  Administered 2021-04-21: 3 mL via INTRADERMAL

## 2021-04-21 MED ORDER — CITALOPRAM HYDROBROMIDE 10 MG PO TABS
10.0000 mg | ORAL_TABLET | Freq: Every day | ORAL | Status: DC
  Administered 2021-04-21 – 2021-04-22 (×2): 10 mg via ORAL
  Filled 2021-04-21 (×2): qty 1

## 2021-04-21 MED ORDER — SODIUM CHLORIDE 0.9 % IV SOLN
500.0000 mg | Freq: Once | INTRAVENOUS | Status: AC
  Administered 2021-04-21: 500 mg via INTRAVENOUS
  Filled 2021-04-21: qty 4

## 2021-04-21 MED ORDER — SENNOSIDES-DOCUSATE SODIUM 8.6-50 MG PO TABS
1.0000 | ORAL_TABLET | Freq: Every day | ORAL | Status: DC
  Administered 2021-04-21 – 2021-04-22 (×2): 1 via ORAL
  Filled 2021-04-21 (×2): qty 1

## 2021-04-21 NOTE — Progress Notes (Signed)
Physical Therapy Treatment Patient Details Name: Angela Rangel MRN: [id number] DOB: Jun 08, 1985 Today's Date: 04/21/2021   History of Present Illness Pt is a 36 y/o female with history of chronic headaches has been using ibuprofen over the last 1 week he has been experiencing severe headache. Dizziness developed since admission. MRI negative for acute changes in areas that were able to be seen, however pt could not tolerate completion of test. No significant PMH noted.    PT Comments    Initially pt reports feeling improved today, however with sudden onset of headache and dizziness upon stand. Orthostatics negative (112/87 in sitting, 111/86 in standing), no nystagmus noted, and dizziness did not subside until pt laying back down in bed (HOB was slightly elevated). RN updated at end of session. Will continue to follow, however anticipate pt will progress well from a functional standpoint as headache/dizziness subside. Will continue to follow.    Recommendations for follow up therapy are one component of a multi-disciplinary discharge planning process, led by the attending physician.  Recommendations may be updated based on patient status, additional functional criteria and insurance authorization.  Follow Up Recommendations  No PT follow up (dizziness does not appear to be BPPV related)     Assistance Recommended at Discharge Frequent or constant Supervision/Assistance  Patient can return home with the following A little help with walking and/or transfers;Assistance with cooking/housework;Assist for transportation;Help with stairs or ramp for entrance;A little help with bathing/dressing/bathroom   Equipment Recommendations  None recommended by PT    Recommendations for Other Services       Precautions / Restrictions Precautions Precautions: Fall Restrictions Weight Bearing Restrictions: No     Mobility  Bed Mobility Overal bed mobility: Modified Independent                   Transfers Overall transfer level: Needs assistance Equipment used: None Transfers: Sit to/from Stand Sit to Stand: Supervision           General transfer comment: Pt performed several sit<>stands throughout session without assistance. After onset of dizziness with standing, pt reaching out for external support and closing eyes. Would open eyes briefly to command but mostly wanting to keep them closed.    Ambulation/Gait Ambulation/Gait assistance: Min guard Gait Distance (Feet): 80 Feet Assistive device: None Gait Pattern/deviations: Step-through pattern, Trunk flexed, Narrow base of support, Antalgic Gait velocity: decreased     General Gait Details: Slow and antalgic initially. When dizziness worsened pt leaning on railing in hall with eyes closed.   Stairs             Wheelchair Mobility    Modified Rankin (Stroke Patients Only)       Balance Overall balance assessment: Needs assistance Sitting-balance support: No upper extremity supported, Feet supported Sitting balance-Leahy Scale: Good     Standing balance support: No upper extremity supported, Single extremity supported Standing balance-Leahy Scale: Fair Standing balance comment: Reaching out for external support 2 dizziness.                            Cognition Arousal/Alertness: Awake/alert Behavior During Therapy: Flat affect Overall Cognitive Status: Within Functional Limits for tasks assessed                                          Exercises  General Comments  Video interpreter utilized throughout session.       Pertinent Vitals/Pain Pain Assessment Pain Assessment: Faces Faces Pain Scale: Hurts little more Pain Location: points to crown of head Pain Descriptors / Indicators: Grimacing, Headache Pain Intervention(s): Limited activity within patient's tolerance, Monitored during session, Repositioned    Home Living                           Prior Function            PT Goals (current goals can now be found in the care plan section) Acute Rehab PT Goals Patient Stated Goal: Go home PT Goal Formulation: With patient/family Time For Goal Achievement: 05/03/21 Potential to Achieve Goals: Good Progress towards PT goals: Progressing toward goals    Frequency    Min 3X/week      PT Plan Current plan remains appropriate    Co-evaluation              AM-PAC PT "6 Clicks" Mobility   Outcome Measure  Help needed turning from your back to your side while in a flat bed without using bedrails?: None Help needed moving from lying on your back to sitting on the side of a flat bed without using bedrails?: None Help needed moving to and from a bed to a chair (including a wheelchair)?: A Little Help needed standing up from a chair using your arms (e.g., wheelchair or bedside chair)?: A Little Help needed to walk in hospital room?: A Little Help needed climbing 3-5 steps with a railing? : A Little 6 Click Score: 20    End of Session   Activity Tolerance: Patient limited by fatigue;Treatment limited secondary to medical complications (Comment) (dizziness) Patient left: in bed;with call bell/phone within reach (Ice pack provided end of session for headache) Nurse Communication: Mobility status PT Visit Diagnosis: Unsteadiness on feet (R26.81);Other abnormalities of gait and mobility (R26.89);Other symptoms and signs involving the nervous system (E08.144)     Time: 8185-6314 PT Time Calculation (min) (ACUTE ONLY): 30 min  Charges:  $Gait Training: 23-37 mins                     Angela Rangel, PT, DPT Acute Rehabilitation Services Pager: [phone] Office: [phone]    Angela Rangel 04/21/2021, 1:14 PM

## 2021-04-21 NOTE — Plan of Care (Signed)

## 2021-04-21 NOTE — Procedures (Signed)
Attempted LP at bedside. Pt consented. Unable to obtain fluid. Will request fluoro guided.  -- Milon Dikes, MD Neurologist [facility] Pager: [phone]

## 2021-04-21 NOTE — Progress Notes (Addendum)
Neurology Progress Note  Brief HPI: Angela Rangel is a 36 y.o. female with no significant PMHx who presented to the emergency room on 1/24 for evaluation of 2 weeks of headaches, blurred vision.  Subjective: Overnight, patient had episodes of nausea, emesis, and subsequent dizziness with ambulation.   This morning, she reports some continued nausea, as well as HA and constipation (no BM since Monday). We discuss that thus far her workup has been normal. We then tease out her mood symptoms, revealing that over the past 3-4 months, patient has had low mood with anhedonia, psychomotor retardation, intermittently poor focus, and guilt about being unable to help her sister through a difficult situation in [place] Over the past two weeks, she began having these R parietal lobe headaches and R sided neck tension, which she endorses worsens with stress. Additionally, she has had a decrease in her PO intake; she denies difficulties with sleep averaging 7-8 hours/ night. She denies SI/HI, and is able to contract for safety upon returning home. She denies history of manic episodes, sx of generalized anxiety, substance use, and prior depressive episodes.   Exam: Vitals:   04/21/21 0827 04/21/21 1124  BP: 107/74 120/82  Pulse: (!) 57 73  Resp: 18 18  Temp: 98.5 F (36.9 C) 98.3 F (36.8 C)  SpO2: 100% 99%   Physical Exam  Constitutional: Appears well-developed and well-nourished.  Psych: Affect appropriate to situation Eyes: No scleral injection HENT: No OP obstrucion MSK: no joint deformities.  Cardiovascular: Normal rate and regular rhythm.  Respiratory: Effort normal, non-labored breathing GI: Soft.  Mild distension. LLQ tenderness to palpation.  Skin: WDI  Neuro: Mental Status: Patient is awake, alert, oriented to person, place, month, year, and situation. Patient is able to give a clear and coherent history. No signs of aphasia or neglect Cranial Nerves: II: Visual Fields are full. Pupils  are equal, round, and reactive to light.   III,IV, VI: EOMI without ptosis or diploplia.  V: Facial sensation is symmetric to temperature VII: Facial movement is symmetric resting and smiling VIII: Hearing is intact to voice X: Palate elevates symmetrically XI: Shoulder shrug is symmetric. XII: Tongue protrudes midline without atrophy or fasciculations.  Motor: Tone is normal. Bulk is normal. 5/5 strength was present in all four extremities.  Sensory: Sensation is symmetric to light touch in the arms and legs. No extinction to DSS present.  Deep Tendon Reflexes: 2+ and symmetric in the biceps and patellae.  Cerebellar: FNF intact bilaterally Gait: Deferred  Pertinent Labs: CSF culture w Gram Stain     Component 09:07  Specimen Description CSF   Special Requests NONE   Gram Stain WBC PRESENT, PREDOMINANTLY MONONUCLEAR  NO ORGANISMS SEEN  CYTOSPIN SMEAR      CSF cell count with differential     Component Ref Range & Units 09:07  Tube #  3   Color, CSF COLORLESS COLORLESS   Appearance, CSF CLEAR CLEAR   Supernatant  NOT INDICATED   RBC Count, CSF 0 /cu mm 265 High    WBC, CSF 0 - 5 /cu mm 4   Other Cells, CSF  TOO FEW TO COUNT, SMEAR AVAILABLE FOR REVIEW      Protein, CSF     Component Ref Range & Units 09:07  Total  Protein, CSF 15 - 45 mg/dL 43      Glucose, CSF     Component Ref Range & Units 09:07  Glucose, CSF 40 - 70 mg/dL 52  Imaging Reviewed:   Assessment: Patient is a 36 year old female evaluated for worsening HA. All neurological workup as well as previous history negative for increased pressure HA/ IIH. Infectious cause unlikely based on CSF results. With history obtained today, HA likely 2/2 stress and MDD, psychiatric rather than neurologic cause. Nausea and abdominal pain likely 2/2 lack of BM since Monday and poor PO intake.   Recommendations: 1)Initiate Celexa 10 mg daily PO for major depressive disorder. (r/b/se/a to medication reviewed  and she consents to med trial). Advised patient to follow-up with primary care or psychiatry outpatient for management. 2)Initiate one-time dose of Solumedrol 500 mg IV for HA. 3)Recommend Senna 1 tablet daily for constipation 4)Upon completion of Solumedrol therapy, patient ok to discharge home from neurological standpoint with outpatient follow-up in a headache clinic.   Pt seen by Neuro Psych Resident and later by attending MD. Note/plan to be edited by MD as needed.    Lamar Sprinkles, MD PGY-1 04/21/2021  12:09 PM    Attending Neurohospitalist Addendum Patient seen and examined with APP/Resident. Agree with the history and physical as documented above. Agree with the plan as documented, which I helped formulate. I have independently reviewed the chart, obtained history, review of systems and examined the patient.I have personally reviewed pertinent head/neck/spine imaging (CT/MRI).  CSF with normal opening pressure-studies not suggestive of aseptic meningitis, infectious process or inflammatory process.  Exam yesterday also had some inconsistent sensory findings but given the benefit of doubt, we proceeded with doing our due diligence.  Headaches likely stress-induced and related to depression as documented above. Discussed plan with Dr. Pola Corn secure chat. Neurology will be available as needed.  Please feel free to call with any questions.  -- Milon Dikes, MD Neurologist [facility] Pager: [phone]

## 2021-04-21 NOTE — Progress Notes (Signed)
PROGRESS NOTE  Angela Rangel  DOB: 1985/06/27  PCP: Patient, No Pcp Per (Inactive) QIO:[id number]  DOA: 04/18/2021  LOS: 1 day  Hospital Day: 4  Chief Complaint  Patient presents with   Headache   Brief narrative: Angela Rangel is a 36 y.o. female with PMH significant for chronic headaches.  Patient presented to the ED on 04/18/2021 with severe pounding headache mostly on the right side for a week with blurred vision and weakness in the extremities nonresponding to ibuprofen.  Associated with nausea, vomiting.  In the ED, patient was given migraine cocktail despite which headache persisted. CT head unremarkable Neurology was consulted Admitted to hospitalist service MRI MRV obtained. 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  Subjective: Patient was seen and examined this morning.   Lying on bed.  Continues to have headache. Later this morning, she underwent fluoroscopy guided lumbar puncture.  Normal opening pressure.  Pending CSF analysis.  Assessment/Plan: Persistent headache Suspect idiopathic intracranial hypertension -Patient has chronic headache, now presented with 2 weeks of headache, stabbing quality with blurred vision.  Headache worsens on standing up and gets better when lying down -Imagings as above. -Neurology consultation was obtained. -Later this morning, she underwent fluoroscopy guided lumbar puncture.  Normal opening pressure.  Pending CSF analysis.  Mobility: Encourage ambulation Living condition:  Goals of care:   Code Status: Full Code  Nutritional status: Body mass index is 28.29 kg/m.      Diet:  Diet Order             Diet Heart Room service appropriate? Yes; Fluid consistency: Thin  Diet effective now                  DVT prophylaxis:  SCD's Start: 04/19/21 0605   Antimicrobials:  None Fluid: None Consultants: Neurology Family Communication: None at bedside  Status is: Observation  Continue in-hospital care because: Pending CSF analysis Level of care: Telemetry Medical   Dispo: The patient is from: Home              Anticipated d/c is to: Home              Patient currently is not medically stable to d/c.   Difficult to place patient No     Infusions:   lactated ringers 100 mL/hr at 04/21/21 0418    Scheduled Meds:   stroke: mapping our early stages of recovery book   Does not apply Once   aspirin  300 mg Rectal Daily   Or   aspirin  325 mg Oral Daily   lidocaine (PF)  5 mL Other Once    PRN meds: acetaminophen **OR** acetaminophen (TYLENOL) oral liquid 160 mg/5 mL **OR** acetaminophen, ondansetron (ZOFRAN) IV, traMADol   Antimicrobials: Anti-infectives (From admission, onward)    None       Objective: Vitals:   04/21/21 0416 04/21/21 0827  BP: 113/84 107/74  Pulse: (!) 58 (!) 57  Resp: 16 18  Temp: 98.3 F (36.8 C) 98.5 F (36.9 C)  SpO2: 96% 100%   No intake or output data in the 24 hours ending 04/21/21 1140  Filed Weights   04/18/21 1909  Weight: 77.1 kg   Weight change:  Body mass index is 28.29 kg/m.   Physical Exam: General exam: Pleasant young Hispanic female. Skin: No rashes, lesions or ulcers. HEENT: Atraumatic, normocephalic, no  obvious bleeding Lungs: Clear to auscultation bilaterally CVS: Regular rate and rhythm, no murmur GI/Abd soft, nontender, nondistended, bowel sound present CNS: Alert, awake, oriented x3 Psychiatry: Sad affect Extremities: No pedal edema, no calf tenderness  Data Review: I have personally reviewed the laboratory data and studies available.  F/u labs none Unresulted Labs (From admission, onward)     Start     Ordered   04/20/21 1558  HSV 1/2 PCR, CSF Cerebrospinal Fluid  Once,   R        04/20/21 1557   04/20/21 1558  VZV PCR, CSF  Once,   R        04/20/21 1557   04/20/21  1557  CSF cell count with differential collection tube #: 4  Once,   R       Question Answer Comment  collection tube # 4   Are there also cytology or pathology orders on this specimen? Yes      04/20/21 1557            Signed, Lorin Glass, MD [facility] 04/21/2021

## 2021-04-21 NOTE — Progress Notes (Signed)
Situation: Patient given tramadol at 2016 1/25 experiencing nausea, vomiting episodes upon using the bathroom around 2235. When finished, pt became dizzy when attempting to walk out the bathroom with her husband and myself. She desired to go back to her bed and reiterated that laying in bed would help her.   Background: Patient had a similar episode earlier in the afternoon with dayshift RN and pt was given Tramadol 100mg  around 11AM 1/25.  Patient was not given tramodol last night (1/24-1/25 PM shift) when I had her and did not experience such vomiting. Still slight nausea, but no vomiting.   Assessment: Patient having headache, nausea, and dizziness. VS were later taken at 2300. Patient attempted to drink a bit of water. Rested and became a bit more lively/willing for conversation afterwards.  Recommendations/New orders: Paged MD and were given new orders for IV LR Bolus [birth year]-1982 and 121ml/hr LR afterwards. Also gave pt Zofran PRN and one time dose of Toradol for headache.

## 2021-04-22 ENCOUNTER — Other Ambulatory Visit (HOSPITAL_COMMUNITY): Payer: Self-pay

## 2021-04-22 MED ORDER — CITALOPRAM HYDROBROMIDE 10 MG PO TABS: 10.0000 mg | ORAL_TABLET | Freq: Every day | ORAL | 0 refills | Status: DC

## 2021-04-22 MED ORDER — CITALOPRAM HYDROBROMIDE 10 MG PO TABS
10.0000 mg | ORAL_TABLET | Freq: Every day | ORAL | 0 refills | Status: AC
  Filled 2021-04-22: qty 30, 30d supply, fill #0

## 2021-04-22 NOTE — TOC Transition Note (Signed)
Transition of Care [facility]) - CM/SW Discharge Note   Patient Details  Name: Angela Rangel MRN: [id number] Date of Birth: 04-30-1985  Transition of Care [facility] CM/SW Contact:  Pollie Friar, RN Phone Number: 04/22/2021, 11:29 AM   Clinical Narrative:    Patient is discharging home with outpatient therapy through the [facility]. Orders in Epic and information on the AVS. No DME needs.  Medications for home sent to her pharmacy and should only have a $4 co pay.  Pt has transport home.    Final next level of care: OP Rehab Barriers to Discharge: Inadequate or no insurance, Barriers Unresolved (comment)   Patient Goals and CMS Choice     Choice offered to / list presented to : Patient, Spouse  Discharge Placement                       Discharge Plan and Services   Discharge Planning Services: CM Consult                                 Social Determinants of Health (SDOH) Interventions     Readmission Risk Interventions No flowsheet data found.

## 2021-04-22 NOTE — Discharge Summary (Signed)
Physician Discharge Summary  Mariaisabel Bodiford Melara ZOX:[id number] DOB: 09-17-1985 DOA: 04/18/2021  PCP: Patient, No Pcp Per (Inactive)  Admit date: 04/18/2021 Discharge date: 04/22/2021  Admitted From: Home Discharge disposition: Home   Code Status: Full Code   Discharge Diagnosis:   Principal Problem:   Headache   Chief Complaint  Patient presents with   Headache   Brief narrative: Sonyia Muro is a 36 y.o. female with PMH significant for chronic headaches.  Patient presented to the ED on 04/18/2021 with severe pounding headache mostly on the right side for a week with blurred vision and weakness in the extremities nonresponding to ibuprofen.  Associated with nausea, vomiting.  In the ED-, patient was given migraine cocktail despite which headache persisted. CT head unremarkable Neurology was consulted Admitted to hospitalist service MRI MRV obtained. 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 Neurology consult obtained.  Subjective: Patient was seen and examined this morning.   Lying on bed.  Headache improved.  Feels ready to go home today.  Assessment/Plan: Persistent headache -Underwent MRI, MRV as above.  MRV suggested possible idiopathic intracranial hypertension. -Lumbar puncture was obtained.  Opening pressure was normal.  CSF analysis normal.  Studies not suggestive of aseptic meningitis, infectious process or inflammatory process. -Per neurology, headaches likely steroid-induced and related to depression. -Patient was given a dose of Solu-Medrol IV 5 mg. -Patient has been initiated on Celexa 10 mg daily for depression.  She will be discharged on it to follow-up with neurology as an outpatient.  Depression -Presented with headache, constipation.  On revisiting her history, she mentioned that for the past 3 to 4 months she has  had low mood with anhedonia, psychomotor retardation, intermittently poor focus and guilt about being unable to help her sister throughout because education in [place]  Over the past 2 weeks, patient began having right parietal lobe headaches, right-sided neck tension that worsens with stress.  Additionally, she has had a decrease in her p.o. intake.  She denies any suicidal/homicidal ideation. -Neurology started the patient on Celexa.  Discharge Medications:   Allergies as of 04/22/2021       Reactions   Latex Itching   Itching and hives        Medication List     STOP taking these medications    acetaminophen 500 MG tablet Commonly known as: TYLENOL   ibuprofen 200 MG tablet Commonly known as: ADVIL   simethicone 80 MG chewable tablet Commonly known as: MYLICON       TAKE these medications    citalopram 10 MG tablet Commonly known as: CELEXA Take 1 tablet (10 mg total) by mouth daily. Start taking on: April 23, 2021        Wound care:   Incision - 3 Ports Abdomen Right;Upper Left;Lower Umbilicus (Active)  Placement Date/Time: 08/26/19 1221   Location of Ports: Abdomen  Location Orientation: Right;Upper  Location Orientation: Left;Lower  Location Orientation: Umbilicus    Assessments 08/26/2019  1:03 PM 08/27/2019  8:37 AM  Port 1 Site Assessment -- [employer] 1 Margins -- Attached edges (approximated)  Port 1 Drainage Amount -- None  Port 1 Dressing Type Liquid skin adhesive Liquid skin adhesive  Port 1 Dressing Status -- Clean;Dry;Intact  Port 2 Site Assessment -- [employer] 2 Margins -- Attached edges (approximated)  Port 2 Drainage Amount -- None  Port  2 Dressing Type Liquid skin adhesive Liquid skin adhesive  Port 2 Dressing Status -- Clean;Dry;Intact  Port 3 Site Assessment -- Clean;Dry  Port 3 Margins -- Attached edges (approximated)  Port 3 Drainage Amount -- None  Port 3 Dressing Type Liquid skin adhesive Liquid skin adhesive  Port 3  Dressing Status -- Clean;Dry;Intact     No Linked orders to display    Discharge Instructions:   Discharge Instructions     Ambulatory referral to Neurology   Complete by: As directed    An appointment is requested in approximately: 1 week   Ambulatory referral to Occupational Therapy   Complete by: As directed    Ambulatory referral to Physical Therapy   Complete by: As directed    Call MD for:  difficulty breathing, headache or visual disturbances   Complete by: As directed    Call MD for:  extreme fatigue   Complete by: As directed    Call MD for:  hives   Complete by: As directed    Call MD for:  persistant dizziness or light-headedness   Complete by: As directed    Call MD for:  persistant nausea and vomiting   Complete by: As directed    Call MD for:  severe uncontrolled pain   Complete by: As directed    Call MD for:  temperature >100.4   Complete by: As directed    Diet general   Complete by: As directed    Discharge instructions   Complete by: As directed    General discharge instructions:  Follow with Primary MD Patient, No Pcp Per (Inactive) in 7 days   Get CBC/BMP checked in next visit within 1 week by PCP or SNF MD. (We routinely change or add medications that can affect your baseline labs and fluid status, therefore we recommend that you get the mentioned basic workup next visit with your PCP, your PCP may decide not to get them or add new tests based on their clinical decision)  On your next visit with your PCP, please get your medicines reviewed and adjusted.  Please request your PCP  to go over all hospital tests, procedures, radiology results at the follow up, please get all Hospital records sent to your PCP by signing hospital release before you go home.  Activity: As tolerated with Full fall precautions use walker/cane & assistance as needed  Avoid using any recreational substances like cigarette, tobacco, alcohol, or non-prescribed drug.  If you  experience worsening of your admission symptoms, develop shortness of breath, life threatening emergency, suicidal or homicidal thoughts you must seek medical attention immediately by calling 911 or calling your MD immediately  if symptoms less severe.  You must read complete instructions/literature along with all the possible adverse reactions/side effects for all the medicines you take and that have been prescribed to you. Take any new medicine only after you have completely understood and accepted all the possible adverse reactions/side effects.   Do not drive, operate heavy machinery, perform activities at heights, swimming or participation in water activities or provide baby sitting services if your were admitted for syncope or siezures until you have seen by Primary MD or a Neurologist and advised to do so again.  Do not drive when taking Pain medications.  Do not take more than prescribed Pain, Sleep and Anxiety Medications  Wear Seat belts while driving.  Please note You were cared for by a hospitalist during your hospital stay. If you have any questions about  your discharge medications or the care you received while you were in the hospital after you are discharged, you can call the unit and asked to speak with the hospitalist on call if the hospitalist that took care of you is not available. Once you are discharged, your primary care physician will handle any further medical issues. Please note that NO REFILLS for any discharge medications will be authorized once you are discharged, as it is imperative that you return to your primary care physician (or establish a relationship with a primary care physician if you do not have one) for your aftercare needs so that they can reassess your need for medications and monitor your lab values.   Increase activity slowly   Complete by: As directed        Follow ups:    Follow-up Information     [facility] Schedule an appointment as soon as possible for a visit in 1 week(s).   Specialty: Rehabilitation Contact information: [address] [id number] [place] [id number] [phone]        [facility] Follow up on 05/19/2021.   Why: Your appointment is at 9:10 am. Please arrive early and bring a picture ID and your current medications. Contact information: [place] [place] [phone]        [facility] Follow up.   Why: please use this pharmacy for your medication needs. Contact information: [address] [place], [place] [address] [phone]        [facility] Follow up.   Contact information: [address] [place] [phone]                Discharge Exam:   Vitals:   04/22/21 0009 04/22/21 0352 04/22/21 0801 04/22/21 1108  BP: 107/68 103/65 108/73 (!) 98/55  Pulse: 77 69 79 60  Resp: 19 18 18 18   Temp: 99.6 F (37.6 C) 98 F (36.7 C) 98.6 F (37 C) 98.7 F (37.1 C)  TempSrc: Oral Oral Oral Oral  SpO2: 98% 97% 100% 100%  Weight:      Height:        Body mass index is 28.29 kg/m.  General exam: Pleasant young Hispanic female. Skin: No rashes, lesions or ulcers. HEENT: Atraumatic, normocephalic, no obvious bleeding Lungs: Clear to auscultation bilaterally CVS: Regular rate and rhythm, no murmur GI/Abd soft, nontender, nondistended, bowel sound present CNS: Alert, awake, oriented x3 Psychiatry: Sad affect Extremities: No pedal edema, no calf tenderness  Time coordinating discharge: 35 minutes   The results of significant diagnostics from this hospitalization (including imaging, microbiology, ancillary and laboratory) are listed below for reference.    Procedures and Diagnostic Studies:   CT ANGIO HEAD NECK W WO CM  Result Date:  04/19/2021 CLINICAL DATA:  Headache for 2 months with intermittent vision blurriness. EXAM: CT ANGIOGRAPHY HEAD AND NECK TECHNIQUE: Multidetector CT imaging of the head and neck was performed using the standard protocol during bolus administration of intravenous contrast. Multiplanar CT image reconstructions and MIPs were obtained to evaluate the vascular anatomy. Carotid stenosis measurements (when applicable) are obtained utilizing NASCET criteria, using the distal internal carotid diameter as the denominator. RADIATION DOSE REDUCTION: This exam was performed according to the departmental dose-optimization program which includes automated exposure control, adjustment of the mA and/or kV according to patient size and/or use of iterative reconstruction technique. CONTRAST:  80mL OMNIPAQUE IOHEXOL 350 MG/ML SOLN COMPARISON:  Head CT from yesterday FINDINGS: CTA NECK FINDINGS Aortic arch: Normal with 3 vessel branching. Right carotid system: Vessels are smooth and widely patent. No atheromatous changes. Left carotid system: Vessels are smooth and widely patent. No atheromatous changes. Vertebral arteries: Vertebral and subclavian arteries are smoothly contoured and widely patent. No atheromatous changes or developmental asymmetry. Skeleton: Unremarkable Other neck: No acute finding. 8 mm nodule in the right lobe thyroid. No followup recommended (ref: J Am Coll Radiol. 2015 Feb;12(2): 143-50). Mucosal thickening in the paranasal sinuses, especially ethmoids. Upper chest: Negative Review of the MIP images confirms the above findings CTA HEAD FINDINGS Anterior circulation: Vessels are smooth and widely patent. Negative for aneurysm or vascular malformation. Posterior circulation: Tiny proximal basilar fenestration. Vessels are smooth and widely patent. Negative for aneurysm. Venous sinuses: Unremarkable Anatomic variants: Fetal type contribution to right PCA flow. Review of the MIP images confirms the above findings  IMPRESSION: Negative CTA of the head and neck. Mucosal thickening in the ethmoid sinuses. Electronically Signed   By: Tiburcio Pea M.D.   On: 04/19/2021 07:11   CT Head Wo Contrast  Result Date: 04/18/2021 CLINICAL DATA:  Headache, sudden, severe Dizziness and vomiting. EXAM: CT HEAD WITHOUT CONTRAST TECHNIQUE: Contiguous axial images were obtained from the base of the skull through the vertex without intravenous contrast. RADIATION DOSE REDUCTION: This exam was performed according to the departmental dose-optimization program which includes automated exposure control, adjustment of the mA and/or kV according to patient size and/or use of iterative reconstruction technique. COMPARISON:  Head CT 06/22/2014 FINDINGS: Brain: No intracranial hemorrhage, mass effect, or midline shift. No hydrocephalus. The basilar cisterns are patent. No evidence of territorial infarct or acute ischemia. No extra-axial or intracranial fluid collection. Vascular: No hyperdense vessel or unexpected calcification. Skull: No fracture or focal lesion. Sinuses/Orbits: Occasional mucosal thickening of included ethmoid air cells. No acute orbital findings. Other: None. IMPRESSION: No acute intracranial abnormality. Electronically Signed   By: Narda Rutherford M.D.   On: 04/18/2021 21:48   MR BRAIN WO CONTRAST  Result Date: 04/19/2021 CLINICAL DATA:  Paralyzing headaches for 3 months, evaluate for CVA EXAM: MRI HEAD WITHOUT CONTRAST TECHNIQUE: Multiplanar, multiecho pulse sequences of the brain and surrounding structures were obtained without intravenous contrast. COMPARISON:  CT head 04/18/2021, CTA head and neck 04/19/2021 FINDINGS: The patient could not tolerate completing the study. Axial DWI, axial T2, and sagittal T1 weighted imaging were obtained. Brain: There is no diffusion-weighted signal abnormality to suggest acute infarct. Parenchymal volume is normal. The ventricles are normal in size. There is no parenchymal signal  abnormality on the provided sequences. There is no definite evidence of acute intracranial hemorrhage or extra-axial fluid collection. There is no mass lesion on the provided sequences. There is no midline shift. Vascular: Normal flow voids. Skull and upper cervical spine: Normal marrow signal. Sinuses/Orbits: There is mild mucosal thickening in the paranasal sinuses. The globes and orbits are unremarkable. Other: None. IMPRESSION: 1. The patient could not tolerate completing the study. Axial diffusion-weighted imaging, axial T2, and sagittal T1 images were obtained. 2. No evidence of acute infarct on the diffusion-weighted images. 3. No other evidence of acute intracranial pathology on the provided sequences. Electronically Signed   By: Lesia Hausen M.D.   On: 04/19/2021 14:51   MR Venogram Head  Result Date: 04/19/2021 CLINICAL DATA:  Headache, papilledema EXAM: MR VENOGRAM HEAD WITHOUT AND WITH CONTRAST TECHNIQUE: Angiographic images of the intracranial venous structures  were acquired using MRV technique with intravenous contrast. CONTRAST:  7.77mL GADAVIST GADOBUTROL 1 MMOL/ML IV SOLN COMPARISON:  None FINDINGS: Superior sagittal sinus, straight sinus, vein of Galen, and internal cerebral veins are patent. Transverse and sigmoid sinuses are patent. Bilateral distal transverse sinus stenosis. There is no abnormal intracranial enhancement.  Sella is not empty. IMPRESSION: No evidence of venous thrombosis. 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 Notably, there is no empty sella present to corroborate this. Electronically Signed   By: Guadlupe Spanish M.D.   On: 04/19/2021 17:32   ECHOCARDIOGRAM COMPLETE  Result Date: 04/19/2021    ECHOCARDIOGRAM REPORT   Patient Name:   ALIANI [facility]CAVALE [facility] Date of Exam: 04/19/2021 Medical Rec #:  [id number]              Height:       65.0 in Accession #:    [id number]              Weight:       170.0 lb Date of Birth:  12-16-1985              BSA:          1.846 m Patient Age:    35 years               BP:           111/80 mmHg Patient Gender: F                      HR:           57 bpm. Exam Location:  Inpatient Procedure: 2D Echo, 3D Echo, Cardiac Doppler and Color Doppler Indications:    Stroke I63.9  History:        Patient has no prior history of Echocardiogram examinations.                 Intractable headache.  Sonographer:    Leta Jungling RDCS Referring Phys: [id number] ARSHAD N KAKRAKANDY IMPRESSIONS  1. Left ventricular ejection fraction, by estimation, is 60 to 65%. The left ventricle has normal function. The left ventricle has no regional wall motion abnormalities. Left ventricular diastolic parameters were normal.  2. Right ventricular systolic function is normal. The right ventricular size is normal. There is normal pulmonary artery systolic pressure.  3. The mitral valve is normal in structure. No evidence of mitral valve regurgitation. No evidence of mitral stenosis.  4. The aortic valve is tricuspid. Aortic valve regurgitation is not visualized. No aortic stenosis is present. Comparison(s): No prior Echocardiogram. FINDINGS  Left Ventricle: Left ventricular ejection fraction, by estimation, is 60 to 65%. The left ventricle has normal function. The left ventricle has no regional wall motion abnormalities. The left ventricular internal cavity size was normal in size. There is  no left ventricular hypertrophy. Left ventricular diastolic parameters were normal. Right Ventricle: The right ventricular size is normal. No increase in right ventricular wall thickness. Right ventricular systolic function is normal. There is normal pulmonary artery systolic pressure. The tricuspid regurgitant velocity is 1.77 m/s, and  with an assumed right atrial pressure of 3 mmHg, the estimated right ventricular systolic pressure is 15.5 mmHg. Left Atrium: Left atrial size was normal in size.  Right Atrium: Right atrial size was normal in size. Pericardium: There is no evidence of pericardial effusion. Mitral Valve: The mitral valve is normal in structure.  No evidence of mitral valve regurgitation. No evidence of mitral valve stenosis. Tricuspid Valve: The tricuspid valve is normal in structure. Tricuspid valve regurgitation is not demonstrated. No evidence of tricuspid stenosis. Aortic Valve: The aortic valve is tricuspid. Aortic valve regurgitation is not visualized. No aortic stenosis is present. Pulmonic Valve: The pulmonic valve was normal in structure. Pulmonic valve regurgitation is not visualized. No evidence of pulmonic stenosis. Aorta: The aortic root and ascending aorta are structurally normal, with no evidence of dilitation. IAS/Shunts: The atrial septum is grossly normal.  LEFT VENTRICLE PLAX 2D LVIDd:         4.10 cm   Diastology LVIDs:         2.60 cm   LV e' medial:    11.40 cm/s LV PW:         0.60 cm   LV E/e' medial:  6.7 LV IVS:        0.70 cm   LV e' lateral:   11.90 cm/s LVOT diam:     1.80 cm   LV E/e' lateral: 6.4 LV SV:         69 LV SV Index:   38 LVOT Area:     2.54 cm  LEFT ATRIUM             Index LA diam:        2.60 cm 1.41 cm/m LA Vol (A2C):   19.0 ml 10.29 ml/m LA Vol (A4C):   27.0 ml 14.63 ml/m LA Biplane Vol: 24.8 ml 13.43 ml/m  AORTIC VALVE LVOT Vmax:   121.00 cm/s LVOT Vmean:  87.200 cm/s LVOT VTI:    0.273 m  AORTA Ao Root diam: 2.60 cm Ao Asc diam:  2.80 cm MITRAL VALVE               TRICUSPID VALVE MV Area (PHT): 4.49 cm    TR Peak grad:   12.5 mmHg MV Decel Time: 169 msec    TR Vmax:        177.00 cm/s MV E velocity: 76.30 cm/s MV A velocity: 54.80 cm/s  SHUNTS MV E/A ratio:  1.39        Systemic VTI:  0.27 m                            Systemic Diam: 1.80 cm Riley Lam MD Electronically signed by Riley Lam MD Signature Date/Time: 04/19/2021/2:12:10 PM    Final      Labs:   Basic Metabolic Panel: Recent Labs  Lab 04/18/21 2013  NA  140  K 4.0  CL 102  CO2 27  GLUCOSE 104*  BUN 13  CREATININE 0.57  CALCIUM 9.5   GFR Estimated Creatinine Clearance: 100.7 mL/min (by C-G formula based on SCr of 0.57 mg/dL). Liver Function Tests: No results for input(s): AST, ALT, ALKPHOS, BILITOT, PROT, ALBUMIN in the last 168 hours. No results for input(s): LIPASE, AMYLASE in the last 168 hours. No results for input(s): AMMONIA in the last 168 hours. Coagulation profile No results for input(s): INR, PROTIME in the last 168 hours.  CBC: Recent Labs  Lab 04/18/21 2013  WBC 10.8*  HGB 14.8  HCT 45.4  MCV 89.7  PLT 312   Cardiac Enzymes: No results for input(s): CKTOTAL, CKMB, CKMBINDEX, TROPONINI in the last 168 hours. BNP: Invalid input(s): POCBNP CBG: No results for input(s): GLUCAP in the last 168 hours. D-Dimer No results for input(s): DDIMER in  the last 72 hours. Hgb A1c No results for input(s): HGBA1C in the last 72 hours. Lipid Profile No results for input(s): CHOL, HDL, LDLCALC, TRIG, CHOLHDL, LDLDIRECT in the last 72 hours. Thyroid function studies No results for input(s): TSH, T4TOTAL, T3FREE, THYROIDAB in the last 72 hours.  Invalid input(s): FREET3 Anemia work up No results for input(s): VITAMINB12, FOLATE, FERRITIN, TIBC, IRON, RETICCTPCT in the last 72 hours. Microbiology Recent Results (from the past 240 hour(s))  Resp Panel by RT-PCR (Flu A&B, Covid) Nasopharyngeal Swab     Status: None   Collection Time: 04/19/21  4:18 AM   Specimen: Nasopharyngeal Swab; Nasopharyngeal(NP) swabs in vial transport medium  Result Value Ref Range Status   SARS Coronavirus 2 by RT PCR NEGATIVE NEGATIVE Final    Comment: (NOTE) SARS-CoV-2 target nucleic acids are NOT DETECTED.  The SARS-CoV-2 RNA is generally detectable in upper respiratory specimens during the acute phase of infection. The lowest concentration of SARS-CoV-2 viral copies this assay can detect is 138 copies/mL. A negative result does not preclude  SARS-Cov-2 infection and should not be used as the sole basis for treatment or other patient management decisions. A negative result may occur with  improper specimen collection/handling, submission of specimen other than nasopharyngeal swab, presence of viral mutation(s) within the areas targeted by this assay, and inadequate number of viral copies(<138 copies/mL). A negative result must be combined with clinical observations, patient history, and epidemiological information. The expected result is Negative.  Fact Sheet for Patients:  BloggerCourse.com  Fact Sheet for Healthcare Providers:  SeriousBroker.it  This test is no t yet approved or cleared by the [place] FDA and  has been authorized for detection and/or diagnosis of SARS-CoV-2 by FDA under an Emergency Use Authorization (EUA). This EUA will remain  in effect (meaning this test can be used) for the duration of the COVID-19 declaration under Section 564(b)(1) of the Act, 21 U.S.C.section 360bbb-3(b)(1), unless the authorization is terminated  or revoked sooner.       Influenza A by PCR NEGATIVE NEGATIVE Final   Influenza B by PCR NEGATIVE NEGATIVE Final    Comment: (NOTE) The Xpert Xpress SARS-CoV-2/FLU/RSV plus assay is intended as an aid in the diagnosis of influenza from Nasopharyngeal swab specimens and should not be used as a sole basis for treatment. Nasal washings and aspirates are unacceptable for Xpert Xpress SARS-CoV-2/FLU/RSV testing.  Fact Sheet for Patients: BloggerCourse.com  Fact Sheet for Healthcare Providers: SeriousBroker.it  This test is not yet approved or cleared by the [place] FDA and has been authorized for detection and/or diagnosis of SARS-CoV-2 by FDA under an Emergency Use Authorization (EUA). This EUA will remain in effect (meaning this test can be used) for the duration of  the COVID-19 declaration under Section 564(b)(1) of the Act, 21 U.S.C. section 360bbb-3(b)(1), unless the authorization is terminated or revoked.  Performed at [facility], [address] [place], [place] [address]   CSF culture w Gram Stain     Status: None (Preliminary result)   Collection Time: 04/21/21  9:07 AM   Specimen: PATH Cytology CSF; Cerebrospinal Fluid  Result Value Ref Range Status   Specimen Description CSF  Final   Special Requests NONE  Final   Gram Stain   Final    WBC PRESENT, PREDOMINANTLY MONONUCLEAR NO ORGANISMS SEEN CYTOSPIN SMEAR    Culture   Final    NO GROWTH < 24 HOURS Performed at [facility], [address]  [place], [place] [address]    Report Status PENDING  Incomplete     Signed: Melina Schools Aadya Kindler  [facility] Hospitalists 04/22/2021, 11:14 AM

## 2021-04-23 NOTE — Progress Notes (Signed)
Patient ready for discharge to home; dressed and waiting on her family; Rx received from Pharmacy and paid; discharge instructions given and reviewed with Spanish translation interpretor's assistance via live video conference. Patient discharged out via wheelchair with volunteer services.

## 2021-04-24 LAB — CSF CULTURE W GRAM STAIN: Culture: NO GROWTH

## 2021-05-19 ENCOUNTER — Inpatient Hospital Stay (INDEPENDENT_AMBULATORY_CARE_PROVIDER_SITE_OTHER): Payer: Self-pay | Admitting: Primary Care

## 2022-03-05 ENCOUNTER — Emergency Department (HOSPITAL_COMMUNITY): Payer: Self-pay

## 2022-03-05 ENCOUNTER — Emergency Department (HOSPITAL_COMMUNITY)
Admission: EM | Admit: 2022-03-05 | Discharge: 2022-03-05 | Disposition: A | Discharge status: Home / Self Care | Payer: Self-pay | Attending: Emergency Medicine | Admitting: Emergency Medicine

## 2022-03-05 DIAGNOSIS — E041 Nontoxic single thyroid nodule: Secondary | ICD-10-CM

## 2022-03-05 DIAGNOSIS — Z91040 Latex allergy status: Secondary | ICD-10-CM | POA: Insufficient documentation

## 2022-03-05 DIAGNOSIS — R55 Syncope and collapse: Secondary | ICD-10-CM

## 2022-03-05 DIAGNOSIS — R4182 Altered mental status, unspecified: Secondary | ICD-10-CM

## 2022-03-05 DIAGNOSIS — R519 Headache, unspecified: Secondary | ICD-10-CM

## 2022-03-05 LAB — CBC
HCT: 39.6 % (ref 36.0–46.0)
Hemoglobin: 12.9 g/dL (ref 12.0–15.0)
MCH: 28.9 pg (ref 26.0–34.0)
MCHC: 32.6 g/dL (ref 30.0–36.0)
MCV: 88.6 fL (ref 80.0–100.0)
Platelets: 244 10*3/uL (ref 150–400)
RBC: 4.47 MIL/uL (ref 3.87–5.11)
RDW: 12.7 % (ref 11.5–15.5)
WBC: 10.1 10*3/uL (ref 4.0–10.5)
nRBC: 0 % (ref 0.0–0.2)

## 2022-03-05 LAB — I-STAT CHEM 8, ED
BUN: 14 mg/dL (ref 6–20)
Calcium, Ion: 1.18 mmol/L (ref 1.15–1.40)
Chloride: 105 mmol/L (ref 98–111)
Creatinine, Ser: 0.8 mg/dL (ref 0.44–1.00)
Glucose, Bld: 95 mg/dL (ref 70–99)
HCT: 38 % (ref 36.0–46.0)
Hemoglobin: 12.9 g/dL (ref 12.0–15.0)
Potassium: 3.2 mmol/L — ABNORMAL LOW (ref 3.5–5.1)
Sodium: 138 mmol/L (ref 135–145)
TCO2: 21 mmol/L — ABNORMAL LOW (ref 22–32)

## 2022-03-05 LAB — BASIC METABOLIC PANEL
Anion gap: 8 (ref 5–15)
BUN: 14 mg/dL (ref 6–20)
CO2: 20 mmol/L — ABNORMAL LOW (ref 22–32)
Calcium: 8.7 mg/dL — ABNORMAL LOW (ref 8.9–10.3)
Chloride: 108 mmol/L (ref 98–111)
Creatinine, Ser: 0.82 mg/dL (ref 0.44–1.00)
GFR, Estimated: >60 mL/min (ref >60)
Glucose, Bld: 96 mg/dL (ref 70–99)
Potassium: 3.4 mmol/L — ABNORMAL LOW (ref 3.5–5.1)
Sodium: 136 mmol/L (ref 135–145)

## 2022-03-05 LAB — I-STAT VENOUS BLOOD GAS, ED
Acid-base deficit: 5 mmol/L — ABNORMAL HIGH (ref 0.0–2.0)
Bicarbonate: 20.3 mmol/L (ref 20.0–28.0)
Calcium, Ion: 1.19 mmol/L (ref 1.15–1.40)
HCT: 39 % (ref 36.0–46.0)
Hemoglobin: 13.3 g/dL (ref 12.0–15.0)
O2 Saturation: 84 %
Potassium: 3.2 mmol/L — ABNORMAL LOW (ref 3.5–5.1)
Sodium: 138 mmol/L (ref 135–145)
TCO2: 21 mmol/L — ABNORMAL LOW (ref 22–32)
pCO2, Ven: 37 mmHg — ABNORMAL LOW (ref 44–60)
pH, Ven: 7.347 (ref 7.25–7.43)
pO2, Ven: 51 mmHg — ABNORMAL HIGH (ref 32–45)

## 2022-03-05 LAB — HEPATIC FUNCTION PANEL
ALT: 21 U/L (ref 0–44)
AST: 22 U/L (ref 15–41)
Albumin: 3.5 g/dL (ref 3.5–5.0)
Alkaline Phosphatase: 52 U/L (ref 38–126)
Bilirubin, Direct: <0.1 mg/dL (ref 0.0–0.2)
Total Bilirubin: 0.4 mg/dL (ref 0.3–1.2)
Total Protein: 6.6 g/dL (ref 6.5–8.1)

## 2022-03-05 LAB — SALICYLATE LEVEL: Salicylate Lvl: <7 mg/dL — ABNORMAL LOW (ref 7.0–30.0)

## 2022-03-05 LAB — TROPONIN I (HIGH SENSITIVITY): Troponin I (High Sensitivity): 4 ng/L (ref <18)

## 2022-03-05 LAB — CBG MONITORING, ED: Glucose-Capillary: 81 mg/dL (ref 70–99)

## 2022-03-05 LAB — ACETAMINOPHEN LEVEL: Acetaminophen (Tylenol), Serum: 19 ug/mL (ref 10–30)

## 2022-03-05 LAB — I-STAT BETA HCG BLOOD, ED (MC, WL, AP ONLY): I-stat hCG, quantitative: <5 m[IU]/mL (ref <5)

## 2022-03-05 LAB — MAGNESIUM: Magnesium: 2 mg/dL (ref 1.7–2.4)

## 2022-03-05 MED ORDER — LACTATED RINGERS IV BOLUS
1000.0000 mL | Freq: Once | INTRAVENOUS | Status: AC
  Administered 2022-03-05: 1000 mL via INTRAVENOUS

## 2022-03-05 MED ORDER — PROCHLORPERAZINE EDISYLATE 10 MG/2ML IJ SOLN
10.0000 mg | Freq: Once | INTRAMUSCULAR | Status: AC
  Administered 2022-03-05: 10 mg via INTRAVENOUS
  Filled 2022-03-05: qty 2

## 2022-03-05 MED ORDER — IOHEXOL 350 MG/ML SOLN
75.0000 mL | Freq: Once | INTRAVENOUS | Status: AC | PRN
  Administered 2022-03-05: 75 mL via INTRAVENOUS

## 2022-03-05 MED ORDER — NALOXONE HCL 2 MG/2ML IJ SOSY
PREFILLED_SYRINGE | INTRAMUSCULAR | Status: DC | PRN
  Administered 2022-03-05: 2 mg via INTRAVENOUS

## 2022-03-05 MED ORDER — DIPHENHYDRAMINE HCL 50 MG/ML IJ SOLN
25.0000 mg | Freq: Once | INTRAMUSCULAR | Status: AC
  Administered 2022-03-05: 25 mg via INTRAVENOUS
  Filled 2022-03-05: qty 1

## 2022-03-05 MED ORDER — KETOROLAC TROMETHAMINE 15 MG/ML IJ SOLN
30.0000 mg | Freq: Once | INTRAMUSCULAR | Status: AC
  Administered 2022-03-05: 30 mg via INTRAVENOUS
  Filled 2022-03-05: qty 2

## 2022-03-05 NOTE — ED Provider Notes (Signed)
[facility] EMERGENCY DEPARTMENT Provider Note   CSN: [id number] Arrival date & time: 03/05/22  1644     History  Chief Complaint  Patient presents with   Altered Mental Status    Angela Rangel is a 36 y.o. female.  With PMH of chronic headaches, depression previously admitted for chronic headache brought in by EMS from church for unresponsive episode.  According to EMS she was at church when she began to complain of a severe headache and then surrounding people helped assist her to the floor she did not hit her head and was unresponsive for EMS and not responsive to pain.  Her blood sugar was 108.  Her vitals were normal for EMS.  They did not administer any medications for her.  Initially when patient came in, she was not answering any questions or opening eyes or following any commands.  However eventually she began to tear and cry and answer some questions was denying any fevers or recent illness but complaining of a generalized headache.  She had mentioned previously being admitted for headaches.  She did not answer many other questions and was apprehensive.  Of note, patient previously admitted April 18, 2021 to April 22, 2025 where she had MRI MRV suggestive of possible idiopathic intracranial hypertension and had LP obtained which showed normal opening pressure.  They suspected her headaches were likely steroid induced and related to depression.  She was also started on Celexa for depression.   Altered Mental Status      Home Medications Prior to Admission medications   Medication Sig Start Date End Date Taking? Authorizing Provider  citalopram (CELEXA) 10 MG tablet Take 1 tablet (10 mg total) by mouth daily. 04/23/21 05/23/21  Lorin Glass, MD      Allergies    Latex    Review of Systems   Review of Systems  Physical Exam Updated Vital Signs BP 112/71   Pulse 63   Temp 98.2 F (36.8 C) (Oral)   Resp 12   SpO2 100%  Physical  Exam Constitutional: Initially not alert or responsive but eventually would open eyes and answer a few question but not answer any orientation questions Eyes: Conjunctivae are normal. [id number] ENT      Head: Normocephalic and atraumatic.      Nose: No congestion.      Mouth/Throat: Mucous membranes are moist.      Neck: No stridor. Cardiovascular: S1, S2,  Normal and symmetric distal pulses are present in all extremities.Warm and well perfused. Respiratory: Normal respiratory effort. Breath sounds are normal. O2 sat 100 on RA Gastrointestinal: Soft and nontender.  Musculoskeletal: Normal range of motion in all extremities. No pitting edema Neurologic: Normal speech and language.  PERRL. EOMI. Will not answer orientation questions.  Will move all 4 extremities against gravity.  Sensation grossly intact.  No facial droop. Skin: Skin is warm, dry and intact. No rash noted. Psychiatric: Mood and affect are normal. Speech and behavior are normal.  ED Results / Procedures / Treatments   Labs (all labs ordered are listed, but only abnormal results are displayed) Labs Reviewed  BASIC METABOLIC PANEL - Abnormal; Notable for the following components:      Result Value   Potassium 3.4 (*)    CO2 20 (*)    Calcium 8.7 (*)    All other components within normal limits  SALICYLATE LEVEL - Abnormal; Notable for the following components:   Salicylate Lvl <7.0 (*)  All other components within normal limits  I-STAT CHEM 8, ED - Abnormal; Notable for the following components:   Potassium 3.2 (*)    TCO2 21 (*)    All other components within normal limits  I-STAT VENOUS BLOOD GAS, ED - Abnormal; Notable for the following components:   pCO2, Ven 37.0 (*)    pO2, Ven 51 (*)    TCO2 21 (*)    Acid-base deficit 5.0 (*)    Potassium 3.2 (*)    All other components within normal limits  CBC  MAGNESIUM  ACETAMINOPHEN LEVEL  HEPATIC FUNCTION PANEL  I-STAT BETA HCG BLOOD, ED ([facility], [facility], [facility] ONLY)  CBG  MONITORING, ED  TROPONIN I (HIGH SENSITIVITY)    EKG EKG Interpretation  Date/Time:  Sunday March 05 2022 16:50:32 EST Ventricular Rate:  67 PR Interval:  154 QRS Duration: 101 QT Interval:  393 QTC Calculation: 415 R Axis:   58 Text Interpretation: Sinus rhythm RSR' in V1 or V2, probably normal variant Baseline wander in lead(s) V2 No significant change since last tracing Confirmed by Vivien Rossetti ([id number]) on 03/05/2022 5:23:41 PM  Radiology CT Head Wo Contrast  Result Date: 03/05/2022 CLINICAL DATA:  Head trauma.  Mental status change. EXAM: CT ANGIOGRAPHY HEAD AND NECK TECHNIQUE: Multidetector CT imaging of the head and neck was performed using the standard protocol during bolus administration of intravenous contrast. Multiplanar CT image reconstructions and MIPs were obtained to evaluate the vascular anatomy. Carotid stenosis measurements (when applicable) are obtained utilizing NASCET criteria, using the distal internal carotid diameter as the denominator. RADIATION DOSE REDUCTION: This exam was performed according to the departmental dose-optimization program which includes automated exposure control, adjustment of the mA and/or kV according to patient size and/or use of iterative reconstruction technique. CONTRAST:  66mL OMNIPAQUE IOHEXOL 350 MG/ML SOLN COMPARISON:  MRI 04/19/2021.  CT 04/19/2021 FINDINGS: CT HEAD FINDINGS Brain: The brain shows a normal appearance without evidence of malformation, atrophy, old or acute small or large vessel infarction, mass lesion, hemorrhage, hydrocephalus or extra-axial collection. Vascular: No hyperdense vessel. No evidence of atherosclerotic calcification. Skull: Normal.  No traumatic finding.  No focal bone lesion. Sinuses/Orbits: Sinuses are clear. Orbits appear normal. Mastoids are clear. Other: None significant CTA NECK FINDINGS Aortic arch: Normal appearance of the arch. Branching pattern is normal. Right carotid system: Common carotid  artery widely patent to the bifurcation. Carotid bifurcation is normal. Cervical ICA is normal. Left carotid system: Left carotid system similarly normal. Vertebral arteries: Both vertebral artery origins are widely patent. Both vertebral arteries are normal through the cervical region to the foramen magnum. Skeleton: Normal Other neck: 8 mm thyroid nodule on the right. No follow-up recommended. Upper chest: Lung apices are clear. Review of the MIP images confirms the above findings CTA HEAD FINDINGS Anterior circulation: Both internal carotid arteries are widely patent through the skull base and siphon regions. No siphon stenosis. The anterior and middle cerebral vessels are normal. No large vessel occlusion. No proximal stenosis. No aneurysm or vascular malformation. Posterior circulation: Both vertebral arteries widely patent to the basilar artery. No basilar stenosis. Posterior circulation branch vessels are normal. Venous sinuses: Patent and normal. Anatomic variants: None significant. Review of the MIP images confirms the above findings IMPRESSION: 1. Normal head CT. 2. Normal CT angiography of the head and neck. 3. 8 mm thyroid nodule on the right. No follow-up recommended. Electronically Signed   By: Paulina Fusi M.D.   On: 03/05/2022 18:26   CT ANGIO  HEAD NECK W WO CM  Result Date: 03/05/2022 CLINICAL DATA:  Head trauma.  Mental status change. EXAM: CT ANGIOGRAPHY HEAD AND NECK TECHNIQUE: Multidetector CT imaging of the head and neck was performed using the standard protocol during bolus administration of intravenous contrast. Multiplanar CT image reconstructions and MIPs were obtained to evaluate the vascular anatomy. Carotid stenosis measurements (when applicable) are obtained utilizing NASCET criteria, using the distal internal carotid diameter as the denominator. RADIATION DOSE REDUCTION: This exam was performed according to the departmental dose-optimization program which includes automated  exposure control, adjustment of the mA and/or kV according to patient size and/or use of iterative reconstruction technique. CONTRAST:  75mL OMNIPAQUE IOHEXOL 350 MG/ML SOLN COMPARISON:  MRI 04/19/2021.  CT 04/19/2021 FINDINGS: CT HEAD FINDINGS Brain: The brain shows a normal appearance without evidence of malformation, atrophy, old or acute small or large vessel infarction, mass lesion, hemorrhage, hydrocephalus or extra-axial collection. Vascular: No hyperdense vessel. No evidence of atherosclerotic calcification. Skull: Normal.  No traumatic finding.  No focal bone lesion. Sinuses/Orbits: Sinuses are clear. Orbits appear normal. Mastoids are clear. Other: None significant CTA NECK FINDINGS Aortic arch: Normal appearance of the arch. Branching pattern is normal. Right carotid system: Common carotid artery widely patent to the bifurcation. Carotid bifurcation is normal. Cervical ICA is normal. Left carotid system: Left carotid system similarly normal. Vertebral arteries: Both vertebral artery origins are widely patent. Both vertebral arteries are normal through the cervical region to the foramen magnum. Skeleton: Normal Other neck: 8 mm thyroid nodule on the right. No follow-up recommended. Upper chest: Lung apices are clear. Review of the MIP images confirms the above findings CTA HEAD FINDINGS Anterior circulation: Both internal carotid arteries are widely patent through the skull base and siphon regions. No siphon stenosis. The anterior and middle cerebral vessels are normal. No large vessel occlusion. No proximal stenosis. No aneurysm or vascular malformation. Posterior circulation: Both vertebral arteries widely patent to the basilar artery. No basilar stenosis. Posterior circulation branch vessels are normal. Venous sinuses: Patent and normal. Anatomic variants: None significant. Review of the MIP images confirms the above findings IMPRESSION: 1. Normal head CT. 2. Normal CT angiography of the head and neck.  3. 8 mm thyroid nodule on the right. No follow-up recommended. Electronically Signed   By: Paulina Fusi M.D.   On: 03/05/2022 18:26   DG Chest Portable 1 View  Result Date: 03/05/2022 CLINICAL DATA:  Unresponsive episode.  Luke EXAM: PORTABLE CHEST 1 VIEW COMPARISON:  None Available. FINDINGS: The heart size and mediastinal contours are within normal limits. Both lungs are clear. The visualized skeletal structures are unremarkable. IMPRESSION: No active disease. Electronically Signed   By: Larose Hires D.O.   On: 03/05/2022 17:39    Procedures Procedures   Patient remained on constant cardiac monitoring HR personally reviewed, normal sinus rhythm with normal rates. Medications Ordered in ED Medications  naloxone [facility]) injection (2 mg Intravenous Given 03/05/22 1649)  lactated ringers bolus 1,000 mL (1,000 mLs Intravenous New Bag/Given 03/05/22 1730)  iohexol (OMNIPAQUE) 350 MG/ML injection 75 mL (75 mLs Intravenous Contrast Given 03/05/22 1819)  ketorolac (TORADOL) 15 MG/ML injection 30 mg (30 mg Intravenous Given 03/05/22 1904)  prochlorperazine (COMPAZINE) injection 10 mg (10 mg Intravenous Given 03/05/22 1905)  diphenhydrAMINE (BENADRYL) injection 25 mg (25 mg Intravenous Given 03/05/22 1905)    ED Course/ Medical Decision Making/ A&P Clinical Course as of 03/05/22 2000  Sun Mar 05, 2022  1858 Patient reassessed, she is back to  neurologic baseline and well-appearing neurologically intact.  She is still complaining about headache consistent with prior headaches which she has been seen in the past.  Her workup has been all very reassuring.  Her CT head which I personally viewed showed no ICH and CTA head and neck showed no stenosis or aneurysm.  Her labs are all generally unremarkable.  Mild hypokalemia 3.4.  Slightly elevated Tylenol level but no concern for overdose, she takes Tylenol and ibuprofen appropriately for headaches.  Plan to give patient migraine cocktail and referral to  neurology for outpatient follow-up and discussed tricked return precautions.  Patient's family in agreement with plan. [VB]  1958 Patient reassessed and headache improved significantly. Safe for discharge and follow up with neurology. She is in agreement with plan. [VB]    Clinical Course User Index [VB] Mardene SayerBranham, Khalid Lacko C, MD                           Medical Decision Making Moshe Salisburyeresa Silva Prentiss BellsDe Melara is a 36 y.o. female.  With PMH of chronic headaches, depression previously admitted for chronic headache brought in by EMS from church for unresponsive episode.  According to EMS she was at church when she began to complain of a severe headache and then surrounding people helped assist her to the floor she did not hit her head and was unresponsive for EMS and not responsive to pain.  Her blood sugar was 108.  Her vitals were normal for EMS.  They did not administer any medications for her.  Of note, patient previously admitted April 18, 2021 to April 22, 2025 where she had MRI MRV suggestive of possible idiopathic intracranial hypertension and had LP obtained which showed normal opening pressure.  They suspected her headaches were likely steroid induced and related to depression.  She was also started on Celexa for depression.  On arrival, patient would not respond to painful stimulus but was protecting airway breathing appropriately with end-tidal in the 30s clear breath sounds and pupils equal.  She resisted and being dropped on her face and woke up slightly more after 2 mg IV Narcan.  Blood glucose was 81.  EKG obtained normal sinus rhythm with no acute ischemic changes and normal QTc.  Based on her presentation, consider possible conversion disorder although this would be diagnosis of rule out versus ICH versus complex migraine versus intoxication or acute electrolyte abnormality among multiple other etiologies.  I am not concerned for meningitis with nontoxic appearance, hemodynamic stability, no  fever and no meningismus on exam.  Patient's workup reviewed and all generally unremarkable and very reassuring.  She only had a mild hypokalemia 3.4.  Her Tylenol was slightly elevated at 19 but she takes Tylenol and ibuprofen appropriately for ongoing chronic headaches.  Troponin 4 and reassuring, no cardiopulmonary complaints, no concern for ACS.  CTA head and neck obtained with no evidence of stenosis or aneurysm or ICH which I personally reviewed.  She does have a thyroid nodule that is 8 mm with no follow-up recommended on imaging.  Patient was reassessed and at neurologic baseline.  I do not suspect seizure, I do suspect possible vasovagal episode or underlying psychiatric component from event today from bad headache.  Her workup has all been unremarkable and very reassuring.  She is receiving migraine cocktail and then referral to neurology for outpatient follow-up.  Her HA improved with migraine cocktail. Patient understanding of plan and in agreement as well as husband.  Amount and/or Complexity of Data Reviewed Labs: ordered. Radiology: ordered.  Risk Prescription drug management.    Final Clinical Impression(s) / ED Diagnoses Final diagnoses:  Altered mental status, unspecified altered mental status type  Syncope, unspecified syncope type  Bad headache  Thyroid nodule    Rx / DC Orders ED Discharge Orders          Ordered    Ambulatory referral to Neurology       Comments: An appointment is requested in approximately: 2 weeks   03/05/22 1902              Mardene Sayer, MD 03/05/22 2000

## 2022-03-05 NOTE — ED Triage Notes (Signed)
Pt [place] via EMS from church where pt complained of headache and then was assisted to floor. Per EMS, pt not responsive to painful stimuli. EDP at bedside.

## 2022-03-05 NOTE — Discharge Instructions (Addendum)
Las tomografas computarizadas y los anlisis de sangre tambin parecan buenos. Tome sus medicamentos normales para el dolor de cabeza si tiene.  Presta atencin a la dosis total de acetaminophen: No tome ms de 4000 mg en un perodo de 24 horas. Llame a los neurlogos al nmero que aparece arriba para hacer un cita.
